# Patient Record
Sex: Female | Born: 1985 | Race: Black or African American | Hispanic: No | Marital: Single | Smoking: Current every day smoker
Health system: Southern US, Community
[De-identification: ages and names within clinical notes are randomized; demographics above are authoritative.]

---

## 2006-01-24 ENCOUNTER — Emergency Department (HOSPITAL_COMMUNITY): Admission: EM | Admit: 2006-01-24 | Discharge: 2006-01-24 | Payer: Self-pay | Admitting: Emergency Medicine

## 2006-02-05 ENCOUNTER — Emergency Department (HOSPITAL_COMMUNITY): Admission: EM | Admit: 2006-02-05 | Discharge: 2006-02-05 | Payer: Self-pay | Admitting: Emergency Medicine

## 2006-07-09 ENCOUNTER — Emergency Department (HOSPITAL_COMMUNITY): Admission: EM | Admit: 2006-07-09 | Discharge: 2006-07-09 | Payer: Self-pay | Admitting: Emergency Medicine

## 2006-07-22 ENCOUNTER — Inpatient Hospital Stay (HOSPITAL_COMMUNITY): Admission: AD | Admit: 2006-07-22 | Discharge: 2006-07-22 | Payer: Self-pay | Admitting: Obstetrics & Gynecology

## 2015-07-09 ENCOUNTER — Emergency Department
Admission: EM | Admit: 2015-07-09 | Discharge: 2015-07-09 | Disposition: A | Discharge status: Home / Self Care | Payer: Medicaid Other | Attending: Emergency Medicine | Admitting: Emergency Medicine

## 2015-07-09 ENCOUNTER — Emergency Department: Payer: Medicaid Other

## 2015-07-09 ENCOUNTER — Encounter: Payer: Self-pay | Admitting: Medical Oncology

## 2015-07-09 DIAGNOSIS — Z3202 Encounter for pregnancy test, result negative: Secondary | ICD-10-CM | POA: Diagnosis not present

## 2015-07-09 DIAGNOSIS — F419 Anxiety disorder, unspecified: Secondary | ICD-10-CM | POA: Insufficient documentation

## 2015-07-09 DIAGNOSIS — F17200 Nicotine dependence, unspecified, uncomplicated: Secondary | ICD-10-CM | POA: Insufficient documentation

## 2015-07-09 DIAGNOSIS — N200 Calculus of kidney: Secondary | ICD-10-CM | POA: Insufficient documentation

## 2015-07-09 DIAGNOSIS — R109 Unspecified abdominal pain: Secondary | ICD-10-CM | POA: Diagnosis present

## 2015-07-09 DIAGNOSIS — R1032 Left lower quadrant pain: Secondary | ICD-10-CM

## 2015-07-09 LAB — CBC
HCT: 39.9 % (ref 35.0–47.0)
Hemoglobin: 13.1 g/dL (ref 12.0–16.0)
MCH: 27.8 pg (ref 26.0–34.0)
MCHC: 32.8 g/dL (ref 32.0–36.0)
MCV: 84.8 fL (ref 80.0–100.0)
PLATELETS: 156 10*3/uL (ref 150–440)
RBC: 4.71 MIL/uL (ref 3.80–5.20)
RDW: 13.5 % (ref 11.5–14.5)
WBC: 7.7 10*3/uL (ref 3.6–11.0)

## 2015-07-09 LAB — URINALYSIS COMPLETE WITH MICROSCOPIC (ARMC ONLY)
BILIRUBIN URINE: NEGATIVE
Bacteria, UA: NONE SEEN
GLUCOSE, UA: NEGATIVE mg/dL
HGB URINE DIPSTICK: NEGATIVE
Nitrite: NEGATIVE
Protein, ur: 30 mg/dL — AB
SPECIFIC GRAVITY, URINE: 1.02 (ref 1.005–1.030)
pH: 6 (ref 5.0–8.0)

## 2015-07-09 LAB — COMPREHENSIVE METABOLIC PANEL
ALT: 17 U/L (ref 14–54)
ANION GAP: 12 (ref 5–15)
AST: 28 U/L (ref 15–41)
Albumin: 4.6 g/dL (ref 3.5–5.0)
Alkaline Phosphatase: 77 U/L (ref 38–126)
BILIRUBIN TOTAL: 0.5 mg/dL (ref 0.3–1.2)
BUN: 14 mg/dL (ref 6–20)
CO2: 21 mmol/L — ABNORMAL LOW (ref 22–32)
Calcium: 9.5 mg/dL (ref 8.9–10.3)
Chloride: 106 mmol/L (ref 101–111)
Creatinine, Ser: 0.96 mg/dL (ref 0.44–1.00)
GFR calc Af Amer: >60 mL/min (ref >60)
Glucose, Bld: 101 mg/dL — ABNORMAL HIGH (ref 65–99)
POTASSIUM: 3 mmol/L — AB (ref 3.5–5.1)
Sodium: 139 mmol/L (ref 135–145)
TOTAL PROTEIN: 7.4 g/dL (ref 6.5–8.1)

## 2015-07-09 LAB — POCT PREGNANCY, URINE: PREG TEST UR: NEGATIVE

## 2015-07-09 LAB — LIPASE, BLOOD: Lipase: 32 U/L (ref 11–51)

## 2015-07-09 MED ORDER — HYDROMORPHONE HCL 2 MG PO TABS
2.0000 mg | ORAL_TABLET | Freq: Once | ORAL | Status: AC
  Administered 2015-07-09: 2 mg via ORAL
  Filled 2015-07-09: qty 1

## 2015-07-09 MED ORDER — HYDROMORPHONE HCL 1 MG/ML IJ SOLN
0.5000 mg | Freq: Once | INTRAMUSCULAR | Status: AC
  Administered 2015-07-09: 0.5 mg via INTRAVENOUS
  Filled 2015-07-09: qty 1

## 2015-07-09 MED ORDER — POTASSIUM CHLORIDE CRYS ER 20 MEQ PO TBCR
20.0000 meq | EXTENDED_RELEASE_TABLET | Freq: Once | ORAL | Status: AC
  Administered 2015-07-09: 20 meq via ORAL

## 2015-07-09 MED ORDER — ONDANSETRON HCL 4 MG PO TABS: 4.0000 mg | ORAL_TABLET | Freq: Three times a day (TID) | ORAL | Status: DC | PRN

## 2015-07-09 MED ORDER — ONDANSETRON HCL 4 MG/2ML IJ SOLN
4.0000 mg | Freq: Once | INTRAMUSCULAR | Status: AC
  Administered 2015-07-09: 4 mg via INTRAVENOUS
  Filled 2015-07-09: qty 2

## 2015-07-09 MED ORDER — POTASSIUM CHLORIDE CRYS ER 20 MEQ PO TBCR
EXTENDED_RELEASE_TABLET | ORAL | Status: AC
  Administered 2015-07-09: 20 meq via ORAL
  Filled 2015-07-09: qty 1

## 2015-07-09 MED ORDER — HYDROMORPHONE HCL 2 MG PO TABS: 2.0000 mg | ORAL_TABLET | Freq: Four times a day (QID) | ORAL | Status: DC | PRN

## 2015-07-09 MED ORDER — ONDANSETRON HCL 4 MG/2ML IJ SOLN
INTRAMUSCULAR | Status: AC
  Administered 2015-07-09: 4 mg via INTRAVENOUS
  Filled 2015-07-09: qty 2

## 2015-07-09 MED ORDER — SODIUM CHLORIDE 0.9 % IV BOLUS (SEPSIS)
1000.0000 mL | Freq: Once | INTRAVENOUS | Status: AC
  Administered 2015-07-09: 1000 mL via INTRAVENOUS

## 2015-07-09 MED ORDER — LORAZEPAM 2 MG/ML IJ SOLN
1.0000 mg | Freq: Once | INTRAMUSCULAR | Status: AC
  Administered 2015-07-09: 1 mg via INTRAVENOUS
  Filled 2015-07-09: qty 1

## 2015-07-09 MED ORDER — IBUPROFEN 800 MG PO TABS: 800.0000 mg | ORAL_TABLET | Freq: Three times a day (TID) | ORAL | Status: DC | PRN

## 2015-07-09 MED ORDER — KETOROLAC TROMETHAMINE 30 MG/ML IJ SOLN
30.0000 mg | Freq: Once | INTRAMUSCULAR | Status: AC
  Administered 2015-07-09: 30 mg via INTRAVENOUS
  Filled 2015-07-09: qty 1

## 2015-07-09 MED ORDER — ONDANSETRON HCL 4 MG/2ML IJ SOLN
4.0000 mg | Freq: Once | INTRAMUSCULAR | Status: AC
  Administered 2015-07-09: 4 mg via INTRAVENOUS

## 2015-07-09 NOTE — Discharge Instructions (Signed)
Kidney Stones °Kidney stones (urolithiasis) are deposits that form inside your kidneys. The intense pain is caused by the stone moving through the urinary tract. When the stone moves, the ureter goes into spasm around the stone. The stone is usually passed in the urine.  °CAUSES  °· A disorder that makes certain neck glands produce too much parathyroid hormone (primary hyperparathyroidism). °· A buildup of uric acid crystals, similar to gout in your joints. °· Narrowing (stricture) of the ureter. °· A kidney obstruction present at birth (congenital obstruction). °· Previous surgery on the kidney or ureters. °· Numerous kidney infections. °SYMPTOMS  °· Feeling sick to your stomach (nauseous). °· Throwing up (vomiting). °· Blood in the urine (hematuria). °· Pain that usually spreads (radiates) to the groin. °· Frequency or urgency of urination. °DIAGNOSIS  °· Taking a history and physical exam. °· Blood or urine tests. °· CT scan. °· Occasionally, an examination of the inside of the urinary bladder (cystoscopy) is performed. °TREATMENT  °· Observation. °· Increasing your fluid intake. °· Extracorporeal shock wave lithotripsy--This is a noninvasive procedure that uses shock waves to break up kidney stones. °· Surgery may be needed if you have severe pain or persistent obstruction. There are various surgical procedures. Most of the procedures are performed with the use of small instruments. Only small incisions are needed to accommodate these instruments, so recovery time is minimized. °The size, location, and chemical composition are all important variables that will determine the proper choice of action for you. Talk to your health care provider to better understand your situation so that you will minimize the risk of injury to yourself and your kidney.  °HOME CARE INSTRUCTIONS  °· Drink enough water and fluids to keep your urine clear or pale yellow. This will help you to pass the stone or stone fragments. °· Strain  all urine through the provided strainer. Keep all particulate matter and stones for your health care provider to see. The stone causing the pain may be as small as a grain of salt. It is very important to use the strainer each and every time you pass your urine. The collection of your stone will allow your health care provider to analyze it and verify that a stone has actually passed. The stone analysis will often identify what you can do to reduce the incidence of recurrences. °· Only take over-the-counter or prescription medicines for pain, discomfort, or fever as directed by your health care provider. °· Keep all follow-up visits as told by your health care provider. This is important. °· Get follow-up X-rays if required. The absence of pain does not always mean that the stone has passed. It may have only stopped moving. If the urine remains completely obstructed, it can cause loss of kidney function or even complete destruction of the kidney. It is your responsibility to make sure X-rays and follow-ups are completed. Ultrasounds of the kidney can show blockages and the status of the kidney. Ultrasounds are not associated with any radiation and can be performed easily in a matter of minutes. °· Make changes to your daily diet as told by your health care provider. You may be told to: °¨ Limit the amount of salt that you eat. °¨ Eat 5 or more servings of fruits and vegetables each day. °¨ Limit the amount of meat, poultry, fish, and eggs that you eat. °· Collect a 24-hour urine sample as told by your health care provider. You may need to collect another urine sample every 6-12   months. °SEEK MEDICAL CARE IF: °· You experience pain that is progressive and unresponsive to any pain medicine you have been prescribed. °SEEK IMMEDIATE MEDICAL CARE IF:  °· Pain cannot be controlled with the prescribed medicine. °· You have a fever or shaking chills. °· The severity or intensity of pain increases over 18 hours and is not  relieved by pain medicine. °· You develop a new onset of abdominal pain. °· You feel faint or pass out. °· You are unable to urinate. °  °This information is not intended to replace advice given to you by your health care provider. Make sure you discuss any questions you have with your health care provider. °  °Document Released: 06/28/2005 Document Revised: 03/19/2015 Document Reviewed: 11/29/2012 °Elsevier Interactive Patient Education ©2016 [employer] ° °Please return immediately if condition worsens. Please contact her primary physician or the physician you were given for referral. If you have any specialist physicians involved in her treatment and plan please also contact them. Thank you for using [facility]. ° °

## 2015-07-09 NOTE — ED Provider Notes (Signed)
Time Seen: Approximately 1645  I have reviewed the triage notes  Chief Complaint: Abdominal Pain   History of Present Illness: April DoryCiera Copeland is a 29 y.o. female who reports that she's had some left-sided abdominal pain now for the last week. The patient states she was seen and evaluated by her primary physician was diagnosed with a urinary tract infection and has a current prescription for Macrodantin. Patient states her pain seems to wax and wane in its intensity but is usually present. She states she's had a hard time finding a comfortable position. She denies any fever at home. She's had persistent nausea and vomiting. She states she's had one loose bowel movement but no persistent diarrhea at this time. She denies any melena or hematochezia, vaginal discharge or bleeding. She is not aware of any dysuria, hematuria or urinary frequency. She denies any right-sided abdominal pain   History reviewed. No pertinent past medical history.  There are no active problems to display for this patient.   History reviewed. No pertinent past surgical history.  History reviewed. No pertinent past surgical history.  Current Outpatient Rx  Name  Route  Sig  Dispense  Refill  . HYDROmorphone (DILAUDID) 2 MG tablet   Oral   Take 1 tablet (2 mg total) by mouth every 6 (six) hours as needed for severe pain.   28 tablet   0   . ibuprofen (ADVIL,MOTRIN) 800 MG tablet   Oral   Take 1 tablet (800 mg total) by mouth every 8 (eight) hours as needed.   30 tablet   0   . ondansetron (ZOFRAN) 4 MG tablet   Oral   Take 1 tablet (4 mg total) by mouth every 8 (eight) hours as needed for nausea or vomiting.   21 tablet   0     Allergies:  Review of patient's allergies indicates no known allergies.  Family History: No family history on file.  Social History: Social History  Substance Use Topics  . Smoking status: Current Every Day Smoker  . Smokeless tobacco: None  . Alcohol Use: No      Review of Systems:   10 point review of systems was performed and was otherwise negative:  Constitutional: No fever Eyes: No visual disturbances ENT: No sore throat, ear pain Cardiac: No chest pain Respiratory: No shortness of breath, wheezing, or stridor Abdomen: Pain is difficult for the patient to characterize aware of her location of her discomfort is she points mainly to the left upper and left lower abdominal region Endocrine: No weight loss, No night sweats Extremities: No peripheral edema, cyanosis Skin: No rashes, easy bruising Neurologic: No focal weakness, trouble with speech or swollowing Urologic: No dysuria, Hematuria, or urinary frequency   Physical Exam:  ED Triage Vitals  Enc Vitals Group     BP 07/09/15 1525 113/84 mmHg     Pulse Rate 07/09/15 1525 83     Resp 07/09/15 1525 18     Temp 07/09/15 1525 98.1 F (36.7 C)     Temp Source 07/09/15 1525 Oral     SpO2 07/09/15 1525 99 %     Weight 07/09/15 1525 152 lb (68.947 kg)     Height 07/09/15 1525 5\' 5"  (1.651 m)     Head Cir --      Peak Flow --      Pain Score 07/09/15 1526 10     Pain Loc --      Pain Edu? --  Excl. in GC? --     General: Awake , Alert , and Oriented times 3; very anxious GCS 15 Head: Normal cephalic , atraumatic Eyes: Pupils equal , round, reactive to light Nose/Throat: No nasal drainage, patent upper airway without erythema or exudate.  Neck: Supple, Full range of motion, No anterior adenopathy or palpable thyroid masses Lungs: Clear to ascultation without wheezes , rhonchi, or rales Heart: Regular rate, regular rhythm without murmurs , gallops , or rubs Abdomen: Mild tenderness over the left upper and lower quadrant without any rebound guarding or rigidity bowel sounds positive and symmetric in all 4 quadrants. No organomegaly .        Extremities: 2 plus symmetric pulses. No edema, clubbing or cyanosis Neurologic: normal ambulation, Motor symmetric without deficits,  sensory intact Skin: warm, dry, no rashes   Labs:   All laboratory work was reviewed including any pertinent negatives or positives listed below:  Labs Reviewed  COMPREHENSIVE METABOLIC PANEL - Abnormal; Notable for the following:    Potassium 3.0 (*)    CO2 21 (*)    Glucose, Bld 101 (*)    All other components within normal limits  URINALYSIS COMPLETEWITH MICROSCOPIC ([facility] ONLY) - Abnormal; Notable for the following:    Color, Urine YELLOW (*)    APPearance CLEAR (*)    Ketones, ur 1+ (*)    Protein, ur 30 (*)    Leukocytes, UA TRACE (*)    Squamous Epithelial / LPF 0-5 (*)    All other components within normal limits  URINE CULTURE  LIPASE, BLOOD  CBC  POC URINE PREG, ED  POCT PREGNANCY, URINE   Review of laboratory work shows some leukocytes present but no bacteria present.   Radiology:    I personally reviewed the radiologic studies EXAM: CT ABDOMEN AND PELVIS WITHOUT CONTRAST  TECHNIQUE: Multidetector CT imaging of the abdomen and pelvis was performed following the standard protocol without IV contrast.  COMPARISON: None.  FINDINGS: Lower chest: No significant pulmonary nodules or acute consolidative airspace disease.  Hepatobiliary: Normal liver with no liver mass. Normal gallbladder with no radiopaque cholelithiasis. No biliary ductal dilatation.  Pancreas: Normal, with no mass or duct dilation.  Spleen: Normal size. No mass.  Adrenals/Urinary Tract: Normal adrenals. There is a 0.7 cm hypodense structure in the upper right kidney with a 2 mm calcification in the posterior aspect of the structure (series 2/image 28). Otherwise no right renal stones. No right hydronephrosis. Obstructing 4 mm stone at the left ureterovesical junction, with mild left hydroureteronephrosis and mild left perinephric fat stranding. No stones within the left kidney. No contour deforming renal mass. Normal bladder.  Stomach/Bowel: Grossly normal stomach. Normal caliber  small bowel with no small bowel wall thickening. Normal appendix. Normal large bowel with no diverticulosis, large bowel wall thickening or pericolonic fat stranding.  Vascular/Lymphatic: Normal caliber abdominal aorta. No pathologically enlarged lymph nodes in the abdomen or pelvis.  Reproductive: Grossly normal mildly retroflexed uterus. No adnexal mass.  Other: Small volume simple free fluid in the pelvic cul-de-sac is likely physiologic. No abdominal ascites. No pneumoperitoneum. No focal fluid collection.  Musculoskeletal: No aggressive appearing focal osseous lesions.  IMPRESSION: 1. Obstructing 4 mm stone at the left UVJ, with mild left hydroureteronephrosis and mild left perinephric fat stranding. 2. 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 Recommend follow-up renal sonogram  in 6 months to document stability.    ED Course:  Patient's stay here was uneventful. She had some gradual improvement in her pain. She does have some mild low potassium was given oral potassium here in emergency department. Her persistent nausea and vomiting seems to be under control. Her current clinical presentation and objective findings appears that she has a obstructing stone in the left ureterovesicular junction. Given the size and location of her kidney stone I felt it was most likely to pass in the short period of time. I advised her to return here if she develops a fever, uncontrolled vomiting, or uncontrolled pain. She was given prescription for ibuprofen, Zofran, and Dilaudid on an outpatient basis. Also advised to continue with Macrodantin and has a urine culture pending.  Assessment:  Acute renal colic  Final Clinical Impression:   Final diagnoses:  Abdominal pain, left lower quadrant  Kidney stone     Plan:   Outpatient management Patient was referred to urology unassigned            Jennye Moccasin, MD 07/09/15 2207

## 2015-07-09 NOTE — ED Notes (Signed)
Patient transported to CT 

## 2015-07-09 NOTE — ED Notes (Signed)
Pt to ed via ems with reports of lower abd pain that began 1 week ago and subsided then came back with nausea and vomiting. Pt also took 5mg  oxycodone at home pta.

## 2015-07-09 NOTE — ED Notes (Signed)
Pt complaining of "blacking out". RN notified and assessed patient. VSS. Additional warm blankets given

## 2015-07-09 NOTE — ED Notes (Signed)
Pt. Going home with family. 

## 2015-07-11 LAB — URINE CULTURE

## 2015-09-10 ENCOUNTER — Emergency Department: Payer: Medicaid Other

## 2015-09-10 ENCOUNTER — Emergency Department
Admission: EM | Admit: 2015-09-10 | Discharge: 2015-09-10 | Disposition: A | Discharge status: Home / Self Care | Payer: Medicaid Other | Attending: Emergency Medicine | Admitting: Emergency Medicine

## 2015-09-10 DIAGNOSIS — Z3A01 Less than 8 weeks gestation of pregnancy: Secondary | ICD-10-CM | POA: Insufficient documentation

## 2015-09-10 DIAGNOSIS — N83202 Unspecified ovarian cyst, left side: Secondary | ICD-10-CM | POA: Insufficient documentation

## 2015-09-10 DIAGNOSIS — N760 Acute vaginitis: Secondary | ICD-10-CM

## 2015-09-10 DIAGNOSIS — A599 Trichomoniasis, unspecified: Secondary | ICD-10-CM

## 2015-09-10 DIAGNOSIS — R197 Diarrhea, unspecified: Secondary | ICD-10-CM | POA: Diagnosis not present

## 2015-09-10 DIAGNOSIS — A5901 Trichomonal vulvovaginitis: Secondary | ICD-10-CM | POA: Diagnosis not present

## 2015-09-10 DIAGNOSIS — O23591 Infection of other part of genital tract in pregnancy, first trimester: Secondary | ICD-10-CM | POA: Insufficient documentation

## 2015-09-10 DIAGNOSIS — O3481 Maternal care for other abnormalities of pelvic organs, first trimester: Secondary | ICD-10-CM | POA: Diagnosis not present

## 2015-09-10 DIAGNOSIS — O99331 Smoking (tobacco) complicating pregnancy, first trimester: Secondary | ICD-10-CM | POA: Diagnosis not present

## 2015-09-10 DIAGNOSIS — O210 Mild hyperemesis gravidarum: Secondary | ICD-10-CM | POA: Insufficient documentation

## 2015-09-10 DIAGNOSIS — O26899 Other specified pregnancy related conditions, unspecified trimester: Secondary | ICD-10-CM

## 2015-09-10 DIAGNOSIS — F17200 Nicotine dependence, unspecified, uncomplicated: Secondary | ICD-10-CM | POA: Insufficient documentation

## 2015-09-10 DIAGNOSIS — O9989 Other specified diseases and conditions complicating pregnancy, childbirth and the puerperium: Secondary | ICD-10-CM | POA: Diagnosis present

## 2015-09-10 DIAGNOSIS — R109 Unspecified abdominal pain: Secondary | ICD-10-CM

## 2015-09-10 DIAGNOSIS — R112 Nausea with vomiting, unspecified: Secondary | ICD-10-CM

## 2015-09-10 DIAGNOSIS — B9689 Other specified bacterial agents as the cause of diseases classified elsewhere: Secondary | ICD-10-CM

## 2015-09-10 LAB — CBC
HEMATOCRIT: 40.5 % (ref 35.0–47.0)
HEMOGLOBIN: 13.4 g/dL (ref 12.0–16.0)
MCH: 28 pg (ref 26.0–34.0)
MCHC: 33.1 g/dL (ref 32.0–36.0)
MCV: 84.7 fL (ref 80.0–100.0)
PLATELETS: 176 10*3/uL (ref 150–440)
RBC: 4.78 MIL/uL (ref 3.80–5.20)
RDW: 15.1 % — ABNORMAL HIGH (ref 11.5–14.5)
WBC: 3.7 10*3/uL (ref 3.6–11.0)

## 2015-09-10 LAB — COMPREHENSIVE METABOLIC PANEL WITH GFR
ALT: 12 U/L — ABNORMAL LOW (ref 14–54)
AST: 24 U/L (ref 15–41)
Albumin: 4.4 g/dL (ref 3.5–5.0)
Alkaline Phosphatase: 64 U/L (ref 38–126)
Anion gap: 7 (ref 5–15)
BUN: 6 mg/dL (ref 6–20)
CO2: 22 mmol/L (ref 22–32)
Calcium: 9.6 mg/dL (ref 8.9–10.3)
Chloride: 110 mmol/L (ref 101–111)
Creatinine, Ser: 0.64 mg/dL (ref 0.44–1.00)
GFR calc Af Amer: >60 mL/min (ref >60)
GFR calc non Af Amer: >60 mL/min (ref >60)
Glucose, Bld: 91 mg/dL (ref 65–99)
Potassium: 3.4 mmol/L — ABNORMAL LOW (ref 3.5–5.1)
Sodium: 139 mmol/L (ref 135–145)
Total Bilirubin: 0.4 mg/dL (ref 0.3–1.2)
Total Protein: 7.4 g/dL (ref 6.5–8.1)

## 2015-09-10 LAB — POCT PREGNANCY, URINE: Preg Test, Ur: POSITIVE — AB

## 2015-09-10 LAB — URINALYSIS COMPLETE WITH MICROSCOPIC (ARMC ONLY)
Bacteria, UA: NONE SEEN
Bilirubin Urine: NEGATIVE
Glucose, UA: NEGATIVE mg/dL
Hgb urine dipstick: NEGATIVE
Ketones, ur: NEGATIVE mg/dL
Leukocytes, UA: NEGATIVE
Nitrite: NEGATIVE
Protein, ur: NEGATIVE mg/dL
Specific Gravity, Urine: 1.013 (ref 1.005–1.030)
pH: 9 — ABNORMAL HIGH (ref 5.0–8.0)

## 2015-09-10 LAB — LIPASE, BLOOD: Lipase: 43 U/L (ref 11–51)

## 2015-09-10 LAB — WET PREP, GENITAL
Sperm: NONE SEEN
Yeast Wet Prep HPF POC: NONE SEEN

## 2015-09-10 LAB — CHLAMYDIA/NGC RT PCR (ARMC ONLY)
CHLAMYDIA TR: NOT DETECTED
N gonorrhoeae: NOT DETECTED

## 2015-09-10 LAB — HCG, QUANTITATIVE, PREGNANCY: hCG, Beta Chain, Quant, S: 138 m[IU]/mL — ABNORMAL HIGH (ref <5)

## 2015-09-10 MED ORDER — CEFTRIAXONE SODIUM 250 MG IJ SOLR
INTRAMUSCULAR | Status: AC
  Filled 2015-09-10: qty 250

## 2015-09-10 MED ORDER — AZITHROMYCIN 1 G PO PACK
1.0000 g | PACK | Freq: Once | ORAL | Status: AC
  Administered 2015-09-10: 1 g via ORAL

## 2015-09-10 MED ORDER — METOCLOPRAMIDE HCL 10 MG PO TABS: 10.0000 mg | ORAL_TABLET | Freq: Four times a day (QID) | ORAL | Status: DC | PRN

## 2015-09-10 MED ORDER — METRONIDAZOLE 500 MG PO TABS
500.0000 mg | ORAL_TABLET | Freq: Once | ORAL | Status: AC
  Administered 2015-09-10: 500 mg via ORAL

## 2015-09-10 MED ORDER — CEFTRIAXONE SODIUM 250 MG IJ SOLR
250.0000 mg | Freq: Once | INTRAMUSCULAR | Status: AC
  Administered 2015-09-10: 250 mg via INTRAMUSCULAR

## 2015-09-10 MED ORDER — METRONIDAZOLE 500 MG PO TABS
ORAL_TABLET | ORAL | Status: AC
  Filled 2015-09-10: qty 1

## 2015-09-10 MED ORDER — AZITHROMYCIN 1 G PO PACK
PACK | ORAL | Status: AC
  Filled 2015-09-10: qty 1

## 2015-09-10 MED ORDER — METRONIDAZOLE 500 MG PO TABS: 500.0000 mg | ORAL_TABLET | Freq: Two times a day (BID) | ORAL | Status: AC

## 2015-09-10 MED ORDER — METOCLOPRAMIDE HCL 10 MG PO TABS
10.0000 mg | ORAL_TABLET | Freq: Once | ORAL | Status: AC
  Administered 2015-09-10: 10 mg via ORAL
  Filled 2015-09-10: qty 1

## 2015-09-10 MED ORDER — PRENATAL VITAMINS 0.8 MG PO TABS: 1.0000 | ORAL_TABLET | Freq: Every day | ORAL | Status: DC

## 2015-09-10 NOTE — ED Provider Notes (Addendum)
[facility] Emergency Department Provider Note  ____________________________________________  Time seen: Approximately 4:25PM  I have reviewed the triage vital signs and the nursing notes.   HISTORY  Chief Complaint Abdominal Pain    HPI April Copeland is a 30 y.o. female with a history of Chlamydia and an ectopic pregnancy who is presenting today with left lower quadrant abdominal pain which started suddenly at 4 AM this morning. She's also been having nausea vomiting and diarrhea over the past 4 days. No known sick contacts. No vaginal bleeding or discharge. The pain is nonradiating. Questions if it is another kidney stone but said that the last MG kidney stone she did have radiation to her back with the pain. She describes the pain as an 8 or 9 out of 10 and cramping. Denies any dysuria. Says that she has had a small amount of blood in her stool. Says that her periods are regular. She is a G7 P3.   No past medical history on file.  There are no active problems to display for this patient.   No past surgical history on file.  Current Outpatient Rx  Name  Route  Sig  Dispense  Refill  . HYDROmorphone (DILAUDID) 2 MG tablet   Oral   Take 1 tablet (2 mg total) by mouth every 6 (six) hours as needed for severe pain.   28 tablet   0   . ibuprofen (ADVIL,MOTRIN) 800 MG tablet   Oral   Take 1 tablet (800 mg total) by mouth every 8 (eight) hours as needed.   30 tablet   0   . ondansetron (ZOFRAN) 4 MG tablet   Oral   Take 1 tablet (4 mg total) by mouth every 8 (eight) hours as needed for nausea or vomiting.   21 tablet   0     Allergies Review of patient's allergies indicates no known allergies.  No family history on file.  Social History Social History  Substance Use Topics  . Smoking status: Current Every Day Smoker  . Smokeless tobacco: Not on file  . Alcohol Use: No    Review of Systems Constitutional: No fever/chills Eyes: No visual  changes. ENT: No sore throat. Cardiovascular: Denies chest pain. Respiratory: Denies shortness of breath. Gastrointestinal:  No constipation. Genitourinary: Negative for dysuria. Musculoskeletal: Negative for back pain. Skin: Negative for rash. Neurological: Negative for headaches, focal weakness or numbness.  10-point ROS otherwise negative.  ____________________________________________   PHYSICAL EXAM:  VITAL SIGNS: ED Triage Vitals  Enc Vitals Group     BP 09/10/15 1523 93/64 mmHg     Pulse Rate 09/10/15 1523 68     Resp 09/10/15 1523 16     Temp 09/10/15 1523 98.5 F (36.9 C)     Temp Source 09/10/15 1523 Oral     SpO2 09/10/15 1523 98 %     Weight --      Height --      Head Cir --      Peak Flow --      Pain Score 09/10/15 1525 10     Pain Loc --      Pain Edu? --      Excl. in GC? --     Constitutional: Alert and oriented. Well appearing and in no acute distress. Eyes: Conjunctivae are normal. PERRL. EOMI. Head: Atraumatic. Nose: No congestion/rhinnorhea. Mouth/Throat: Mucous membranes are moist.  Oropharynx non-erythematous. Neck: No stridor.   Cardiovascular: Normal rate, regular rhythm. Grossly normal heart  sounds.  Good peripheral circulation. Respiratory: Normal respiratory effort.  No retractions. Lungs CTAB. Gastrointestinal: Soft with mild epigastric as well as left lower quadrant abdominal tenderness to palpation. There is no rebound or guarding or masses. No distention.No CVA tenderness. Rectal exam with brown stool which is heme-negative. Genitourinary: Normal external exam. Speculum exam with a mild-to-moderate amount of white discharge. Bimanual exam with a closed cervical os. There is no CMT. No uterine or adnexal tenderness to palpation. Musculoskeletal: No lower extremity tenderness nor edema.  No joint effusions. Neurologic:  Normal speech and language. No gross focal neurologic deficits are appreciated. No gait instability. Skin:  Skin is  warm, dry and intact. No rash noted. Psychiatric: Mood and affect are normal. Speech and behavior are normal.  ____________________________________________   LABS (all labs ordered are listed, but only abnormal results are displayed)  Labs Reviewed  WET PREP, GENITAL - Abnormal; Notable for the following:    Trich, Wet Prep PRESENT (*)    Clue Cells Wet Prep HPF POC PRESENT (*)    WBC, Wet Prep HPF POC FEW (*)    All other components within normal limits  COMPREHENSIVE METABOLIC PANEL - Abnormal; Notable for the following:    Potassium 3.4 (*)    ALT 12 (*)    All other components within normal limits  CBC - Abnormal; Notable for the following:    RDW 15.1 (*)    All other components within normal limits  URINALYSIS COMPLETEWITH MICROSCOPIC ([facility] ONLY) - Abnormal; Notable for the following:    Color, Urine YELLOW (*)    APPearance CLEAR (*)    pH 9.0 (*)    Squamous Epithelial / LPF 0-5 (*)    All other components within normal limits  HCG, QUANTITATIVE, PREGNANCY - Abnormal; Notable for the following:    hCG, Beta Chain, Quant, S 138 (*)    All other components within normal limits  POCT PREGNANCY, URINE - Abnormal; Notable for the following:    Preg Test, Ur POSITIVE (*)    All other components within normal limits  CHLAMYDIA/NGC RT PCR ([facility] ONLY)  LIPASE, BLOOD  POC URINE PREG, ED   ____________________________________________  EKG   ____________________________________________  RADIOLOGY  IMPRESSION: 1. No IUP identified at this time. 2. Degenerating corpus luteum cyst in the left ovary. 3. Small volume of free fluid in the cul-de-sac, presumably physiologic.   Electronically Signed By: Trudie Reed M.D. On: 09/10/2015 17:32 ____________________________________________   PROCEDURES    ____________________________________________   INITIAL IMPRESSION / ASSESSMENT AND PLAN / ED COURSE  Pertinent labs & imaging results that were  available during my care of the patient were reviewed by me and considered in my medical decision making (see chart for details).  ----------------------------------------- 6:39 PM on 09/10/2015 -----------------------------------------  Patient lying on the stretcher at this time without any distress. We discussed her lab as well as imaging results. She is aware of the cyst to her left ovary. We also discussed the very low hCG level and this could be either a failed pregnancy or a very early pregnancy. However, with the cyst read as degenerating is likely a failed pregnancy. No IUP was identified. She knows that she must follow-up in 2 days with her OB/GYN at [facility] for repeat hCG/hormone level. We also discussed the positive Trichomonas as well as clue cells. She will be treated for cervicitis as well as the Trichomonas. She understands that she was not have any sexual intercourse until further testing for other  STDs such as syphilis, herpes and HIV. She understands that she must also discussed this with her partner and that her partner should not be having any sex until he is treated and cleared. We'll discharge with antiemetics. Patient has been tolerating by mouth fluids here in the emergency department. Pain possibly secondary to early pregnancy versus nausea vomiting and diarrhea.  ____________________________________________   FINAL CLINICAL IMPRESSION(S) / ED DIAGNOSES  Abdominal pain in pregnancy. Trichomonas. Bacterial vaginosis. Nausea vomiting and diarrhea.    Myrna Blazer, MD 09/10/15 1850  Also discussed return precautions with the patient such as any worsening pain or other concerning symptoms.  Myrna Blazer, MD 09/10/15 [phone]

## 2015-09-10 NOTE — ED Notes (Signed)
Pt reports about 4 days of N/V/D.  Reports having abdominal pain that started today. Pt is located on left lower quadrant.

## 2015-09-10 NOTE — ED Notes (Signed)
Pt presents with diarrhea x 4 days, vomiting x 2 days, and llq abdominal pain today. Pt states she feels like she did when she had kidney stones 2-3 weeks ago. Denies blood in urine, positive for increased urinary frequency. Pt alert & oriented, moaning during assessment.

## 2015-09-10 NOTE — ED Notes (Signed)
Per Ems she developed abd pain for about 4 days pos n/v.  Last time vomited today

## 2017-11-29 ENCOUNTER — Emergency Department
Admission: EM | Admit: 2017-11-29 | Discharge: 2017-11-29 | Disposition: A | Discharge status: Home / Self Care | Payer: Medicaid Other | Attending: Emergency Medicine | Admitting: Emergency Medicine

## 2017-11-29 ENCOUNTER — Encounter: Payer: Self-pay | Admitting: Emergency Medicine

## 2017-11-29 ENCOUNTER — Emergency Department: Payer: Medicaid Other

## 2017-11-29 ENCOUNTER — Other Ambulatory Visit: Payer: Self-pay

## 2017-11-29 DIAGNOSIS — F1210 Cannabis abuse, uncomplicated: Secondary | ICD-10-CM | POA: Diagnosis not present

## 2017-11-29 DIAGNOSIS — R102 Pelvic and perineal pain: Secondary | ICD-10-CM | POA: Diagnosis not present

## 2017-11-29 DIAGNOSIS — F17200 Nicotine dependence, unspecified, uncomplicated: Secondary | ICD-10-CM | POA: Insufficient documentation

## 2017-11-29 DIAGNOSIS — R1031 Right lower quadrant pain: Secondary | ICD-10-CM | POA: Diagnosis not present

## 2017-11-29 DIAGNOSIS — R112 Nausea with vomiting, unspecified: Secondary | ICD-10-CM | POA: Insufficient documentation

## 2017-11-29 DIAGNOSIS — R109 Unspecified abdominal pain: Secondary | ICD-10-CM | POA: Diagnosis present

## 2017-11-29 DIAGNOSIS — Z79899 Other long term (current) drug therapy: Secondary | ICD-10-CM | POA: Insufficient documentation

## 2017-11-29 LAB — CBC
HEMATOCRIT: 38.8 % (ref 35.0–47.0)
HEMOGLOBIN: 12.9 g/dL (ref 12.0–16.0)
MCH: 27.7 pg (ref 26.0–34.0)
MCHC: 33.3 g/dL (ref 32.0–36.0)
MCV: 83.4 fL (ref 80.0–100.0)
PLATELETS: 211 10*3/uL (ref 150–440)
RBC: 4.66 MIL/uL (ref 3.80–5.20)
RDW: 15.8 % — ABNORMAL HIGH (ref 11.5–14.5)
WBC: 4.5 10*3/uL (ref 3.6–11.0)

## 2017-11-29 LAB — URINALYSIS, COMPLETE (UACMP) WITH MICROSCOPIC
BACTERIA UA: NONE SEEN
Bilirubin Urine: NEGATIVE
GLUCOSE, UA: NEGATIVE mg/dL
Hgb urine dipstick: NEGATIVE
KETONES UR: 20 mg/dL — AB
LEUKOCYTES UA: NEGATIVE
Nitrite: NEGATIVE
PROTEIN: 100 mg/dL — AB
Specific Gravity, Urine: 1.013 (ref 1.005–1.030)
pH: 9 — ABNORMAL HIGH (ref 5.0–8.0)

## 2017-11-29 LAB — COMPREHENSIVE METABOLIC PANEL
ALT: 13 U/L — AB (ref 14–54)
AST: 35 U/L (ref 15–41)
Albumin: 3.6 g/dL (ref 3.5–5.0)
Alkaline Phosphatase: 51 U/L (ref 38–126)
Anion gap: 8 (ref 5–15)
BUN: 8 mg/dL (ref 6–20)
CO2: 23 mmol/L (ref 22–32)
CREATININE: 0.96 mg/dL (ref 0.44–1.00)
Calcium: 8.6 mg/dL — ABNORMAL LOW (ref 8.9–10.3)
Chloride: 108 mmol/L (ref 101–111)
Glucose, Bld: 124 mg/dL — ABNORMAL HIGH (ref 65–99)
POTASSIUM: 3 mmol/L — AB (ref 3.5–5.1)
SODIUM: 139 mmol/L (ref 135–145)
Total Bilirubin: 0.7 mg/dL (ref 0.3–1.2)
Total Protein: 5.9 g/dL — ABNORMAL LOW (ref 6.5–8.1)

## 2017-11-29 LAB — POCT PREGNANCY, URINE: PREG TEST UR: NEGATIVE

## 2017-11-29 LAB — LIPASE, BLOOD: Lipase: 45 U/L (ref 11–51)

## 2017-11-29 MED ORDER — DICYCLOMINE HCL 20 MG PO TABS: 20.0000 mg | ORAL_TABLET | Freq: Three times a day (TID) | ORAL | 0 refills | Status: DC | PRN

## 2017-11-29 MED ORDER — PROCHLORPERAZINE EDISYLATE 10 MG/2ML IJ SOLN
10.0000 mg | Freq: Once | INTRAMUSCULAR | Status: AC
  Administered 2017-11-29: 10 mg via INTRAVENOUS
  Filled 2017-11-29: qty 2

## 2017-11-29 MED ORDER — DICYCLOMINE HCL 10 MG/ML IM SOLN
20.0000 mg | Freq: Once | INTRAMUSCULAR | Status: AC
  Administered 2017-11-29: 20 mg via INTRAMUSCULAR
  Filled 2017-11-29 (×2): qty 2

## 2017-11-29 MED ORDER — SUCRALFATE 1 G PO TABS: 1.0000 g | ORAL_TABLET | Freq: Four times a day (QID) | ORAL | 0 refills | Status: DC

## 2017-11-29 MED ORDER — HALOPERIDOL LACTATE 5 MG/ML IJ SOLN
5.0000 mg | Freq: Once | INTRAMUSCULAR | Status: AC
  Administered 2017-11-29: 5 mg via INTRAVENOUS
  Filled 2017-11-29: qty 1

## 2017-11-29 MED ORDER — SODIUM CHLORIDE 0.9 % IV BOLUS
1000.0000 mL | Freq: Once | INTRAVENOUS | Status: AC
  Administered 2017-11-29: 1000 mL via INTRAVENOUS

## 2017-11-29 NOTE — ED Notes (Signed)
Pt assisted to the bathroom by this RN and Susy Frizzle, Charity fundraiser.

## 2017-11-29 NOTE — ED Notes (Signed)
Patient turned on call light in BR.  Found patient lying in floor in BR, covered with blanket.  Patient states she "slipped from [facility]".  Denies any injury.  Assisted to stand and get in [facility].  Taken to Room 8 via [facility], placed on stretcher, siderails up, bed in low position.  Megan RN aware of placement in room 8.

## 2017-11-29 NOTE — ED Notes (Signed)
MD to bedside to discuss [place] results.

## 2017-11-29 NOTE — ED Notes (Signed)
MD notified of patients continued complaints of nausea and pain. VORB for  Compazine and  Bentyl IM.

## 2017-11-29 NOTE — ED Notes (Signed)
This RN back to bedside due to patient calling out. Pt awakens to c/o abdominal pain and ask about status update. This RN explained that patient was asleep and this RN did not wish to disturb patient. Pt back to sleep. Visualized in NAD. VSS and WNL.

## 2017-11-29 NOTE — ED Notes (Signed)
This RN to bedside due to patient calling out, upon arrival to bedside, pt noted to be asleep. Will continue to monitor for further patient needs.

## 2017-11-29 NOTE — ED Notes (Signed)
Patient transported to Ultrasound 

## 2017-11-29 NOTE — ED Notes (Signed)
Patient's discharge and follow up information reviewed with patient by ED nursing staff and patient given the opportunity to ask questions pertaining to ED visit and discharge plan of care. Patient advised that should symptoms not continue to improve, resolve entirely, or should new symptoms develop then a follow up visit with their PCP or a return visit to the ED may be warranted. Patient verbalized consent and understanding of discharge plan of care including potential need for further evaluation. Patient discharged in stable condition per attending ED physician on duty.   Pt leaving with mother who is driving.

## 2017-11-29 NOTE — ED Notes (Signed)
Triage interrupted so patient could go to BR.

## 2017-11-29 NOTE — ED Triage Notes (Signed)
Patient here via [facility] complaining of "stomach pain in the middle of my stomach". Sx started at 0630 this AM, also complaining of "throwing up a million times".  Diarrhea "a lot".  States pain is similar to when she had kidney stones.  Color is pale, skin warm and dry.  Patient moving about in [facility].  IV placed by [facility], right AC, dry and intact.

## 2017-11-29 NOTE — ED Notes (Signed)
Pt remains sleepy however awakens easily. Pt given her personal phones to call her mother. Will continue to monitor for further patient needs.

## 2017-11-29 NOTE — ED Notes (Signed)
Pt returned from US

## 2017-11-29 NOTE — ED Notes (Signed)
MD to bedside at this time 

## 2017-11-29 NOTE — ED Notes (Signed)
High fall risk bracelet applied to patient, yellow socks, yellow fall risk sign applied to outside patient door.

## 2017-11-29 NOTE — Discharge Instructions (Addendum)
Please seek medical attention for any high fevers, chest pain, shortness of breath, change in behavior, persistent vomiting, bloody stool or any other new or concerning symptoms.  

## 2017-11-29 NOTE — ED Notes (Signed)
First Nurse Note:  Patient to ED via [facility], with lower right abdominal pain, N&V&D since 0630 this AM.  EMS reports no emesis, given 4 mg. Of Zofran IV.  O2 sat 100%, 125/74, P-80.  CBG - 121.  Patient told EMS that she donates plasma twice weekly and uses recreational marijuana.  Patient sitting in WC moaning.  To Triage.

## 2017-11-29 NOTE — ED Notes (Signed)
This RN to bedside, pt noted to have removed her EKG leads, BP cuff, and pulse ox. Pt also noted to have moved herself down in the bed. This RN encouraged patient to move back up in the bed, EKG leads placed back on patient. Pt given 2 warm blankets. Pt also c/o needing to use the bathroom, this RN explained due to patient being a high fall risk this RN would need to assist and stay in the room with patient while she was in the bathroom. Pt attempted to stand but then appeared to become unsteady on her feet, this RN assisted patient back to bed. Pt repeatedly states, "my stomach, my stomach, y'all aren't helping me." This RN explained MD in procedure at this time and would be in as soon as possible. Pt rolling around in bed and moaning.

## 2017-11-29 NOTE — ED Notes (Signed)
This RN to bedside due to patient calling out. This RN explained delay and that this RN had already spoken to [place] and delay should not be much longer. Pt rolls her eyes and closes her eyes. VSS. Will continue to monitor for further patient needs.

## 2017-11-29 NOTE — ED Notes (Signed)
Charge Nurse made aware that patient stated she fell, Neurosurgeon states she made Dr. Derrill Kay aware that patient stated she fell.

## 2017-11-29 NOTE — ED Provider Notes (Signed)
[facility] Emergency Department Provider Note   ____________________________________________   I have reviewed the triage vital signs and the nursing notes.   HISTORY  Chief Complaint Nausea; Emesis; and Diarrhea   History limited by: Not Limited   HPI April Copeland is a 32 y.o. female who presents to the emergency department today because of concern for abdominal pain, nausea and vomiting. The patient states that the symptoms started this morning.  The patient's abdominal pain is located in the right lower quadrant.  She describes as both sharp and crampy.  It has been accompanied by multiple episodes of nausea vomiting.  She has not noticed any blood in either of these.  She denies any associated fevers.  She denies any unusual ingestions recently.  States she has had similar pain in the past when she has had ovarian cyst and kidney stones.    Per medical record review patient has a history of emergency department visit for similar symptoms last year.   History reviewed. No pertinent past medical history.  There are no active problems to display for this patient.   History reviewed. No pertinent surgical history.  Prior to Admission medications   Medication Sig Start Date End Date Taking? Authorizing Provider  HYDROmorphone (DILAUDID) 2 MG tablet Take 1 tablet (2 mg total) by mouth every 6 (six) hours as needed for severe pain. 07/09/15   Jennye Moccasin, MD  ibuprofen (ADVIL,MOTRIN) 800 MG tablet Take 1 tablet (800 mg total) by mouth every 8 (eight) hours as needed. 07/09/15   Jennye Moccasin, MD  metoCLOPramide (REGLAN) 10 MG tablet Take 1 tablet (10 mg total) by mouth every 6 (six) hours as needed. 09/10/15   Schaevitz, Myra Rude, MD  ondansetron (ZOFRAN) 4 MG tablet Take 1 tablet (4 mg total) by mouth every 8 (eight) hours as needed for nausea or vomiting. 07/09/15   Jennye Moccasin, MD  Prenatal Multivit-Min-Fe-FA (PRENATAL VITAMINS) 0.8 MG  tablet Take 1 tablet by mouth daily. 09/10/15   Schaevitz, Myra Rude, MD    Allergies Patient has no known allergies.  History reviewed. No pertinent family history.  Social History Social History   Tobacco Use  . Smoking status: Current Every Day Smoker  Substance Use Topics  . Alcohol use: No  . Drug use: Yes    Types: Marijuana    Review of Systems Constitutional: No fever/chills Eyes: No visual changes. ENT: No sore throat. Cardiovascular: Denies chest pain. Respiratory: Denies shortness of breath. Gastrointestinal: Positive for abdominal pain, nausea and vomiting. Genitourinary: Negative for dysuria. Musculoskeletal: Negative for back pain. Skin: Negative for rash. Neurological: Negative for headaches, focal weakness or numbness.  ____________________________________________   PHYSICAL EXAM:  VITAL SIGNS: ED Triage Vitals  Enc Vitals Group     BP 11/29/17 0840 100/75     Pulse Rate 11/29/17 0840 80     Resp 11/29/17 0840 20     Temp 11/29/17 0840 97.8 F (36.6 C)     Temp Source 11/29/17 0840 Oral     SpO2 11/29/17 0840 100 %     Weight 11/29/17 0841 155 lb (70.3 kg)     Height 11/29/17 0841  (1.626 m)     Head Circumference --      Peak Flow --      Pain Score 11/29/17 0850 10   Constitutional: Alert and oriented.  Eyes: Conjunctivae are normal.  ENT   Head: Normocephalic and atraumatic.   Nose: No congestion/rhinnorhea.  Mouth/Throat: Mucous membranes are moist.   Neck: No stridor. Hematological/Lymphatic/Immunilogical: No cervical lymphadenopathy. Cardiovascular: Normal rate, regular rhythm.  No murmurs, rubs, or gallops.  Respiratory: Normal respiratory effort without tachypnea nor retractions. Breath sounds are clear and equal bilaterally. No wheezes/rales/rhonchi. Gastrointestinal: Soft and tender to palpation in the right lower quadrant and right upper quadrant. No rebound. No guarding.  Genitourinary:  Deferred Musculoskeletal: Normal range of motion in all extremities. No lower extremity edema. Neurologic:  Normal speech and language. No gross focal neurologic deficits are appreciated.  Skin:  Skin is warm, dry and intact. No rash noted. Psychiatric: Mood and affect are normal. Speech and behavior are normal. Patient exhibits appropriate insight and judgment.  ____________________________________________    LABS (pertinent positives/negatives)  Lipase 45 CBC wnl except rdw 15.8 CMP k 3.0, glu 124, cr 0.96  ____________________________________________   EKG  None  ____________________________________________    RADIOLOGY  US pelvis Prominent follicle in the right ovary  ____________________________________________   PROCEDURES  Procedures  ____________________________________________   INITIAL IMPRESSION / ASSESSMENT AND PLAN / ED COURSE  Pertinent labs & imaging results that were available during my care of the patient were reviewed by me and considered in my medical decision making (see chart for details).  Patient presented to the emergency department today because of concerns for abdominal pain.  Differential be broad including gastroenteritis, food poisoning, appendicitis, gallbladder disease, ovarian pathology amongst other etiologies.  Patient did not have any fever or leukocytosis.  Patient has had similar presentations in the past.  Ultrasound did show prominent follicle of the right ovary.  Patient did feel better after medication.  At this point I doubt appendicitis.  Discussed findings with patient.  Will plan on discharging with medication.  ____________________________________________   FINAL CLINICAL IMPRESSION(S) / ED DIAGNOSES  Final diagnoses:  Adnexal pain     Note: This dictation was prepared with Dragon dictation. Any transcriptional errors that result from this process are unintentional     Phineas Semen, MD 11/29/17 1530

## 2018-01-28 ENCOUNTER — Inpatient Hospital Stay
Admission: EM | Admit: 2018-01-28 | Discharge: 2018-01-29 | DRG: 391 | Disposition: A | Discharge status: Home / Self Care | Payer: Medicaid Other | Attending: Internal Medicine | Admitting: Internal Medicine

## 2018-01-28 ENCOUNTER — Emergency Department: Payer: Medicaid Other

## 2018-01-28 ENCOUNTER — Encounter: Payer: Self-pay | Admitting: Radiology

## 2018-01-28 DIAGNOSIS — R197 Diarrhea, unspecified: Secondary | ICD-10-CM

## 2018-01-28 DIAGNOSIS — K529 Noninfective gastroenteritis and colitis, unspecified: Secondary | ICD-10-CM | POA: Diagnosis present

## 2018-01-28 DIAGNOSIS — F17200 Nicotine dependence, unspecified, uncomplicated: Secondary | ICD-10-CM | POA: Diagnosis present

## 2018-01-28 DIAGNOSIS — G9341 Metabolic encephalopathy: Secondary | ICD-10-CM | POA: Diagnosis present

## 2018-01-28 DIAGNOSIS — F1220 Cannabis dependence, uncomplicated: Secondary | ICD-10-CM | POA: Diagnosis present

## 2018-01-28 DIAGNOSIS — R112 Nausea with vomiting, unspecified: Secondary | ICD-10-CM

## 2018-01-28 LAB — CBC WITH DIFFERENTIAL/PLATELET
Basophils Absolute: 0 10*3/uL (ref 0–0.1)
Basophils Relative: 1 %
EOS PCT: 1 %
Eosinophils Absolute: 0 10*3/uL (ref 0–0.7)
HCT: 37.9 % (ref 35.0–47.0)
Hemoglobin: 12.4 g/dL (ref 12.0–16.0)
LYMPHS PCT: 16 %
Lymphs Abs: 0.8 10*3/uL — ABNORMAL LOW (ref 1.0–3.6)
MCH: 26.8 pg (ref 26.0–34.0)
MCHC: 32.7 g/dL (ref 32.0–36.0)
MCV: 81.9 fL (ref 80.0–100.0)
MONO ABS: 0.4 10*3/uL (ref 0.2–0.9)
MONOS PCT: 8 %
Neutro Abs: 3.7 10*3/uL (ref 1.4–6.5)
Neutrophils Relative %: 74 %
PLATELETS: 183 10*3/uL (ref 150–440)
RBC: 4.63 MIL/uL (ref 3.80–5.20)
RDW: 16 % — ABNORMAL HIGH (ref 11.5–14.5)
WBC: 5 10*3/uL (ref 3.6–11.0)

## 2018-01-28 LAB — URINALYSIS, COMPLETE (UACMP) WITH MICROSCOPIC
BILIRUBIN URINE: NEGATIVE
Glucose, UA: NEGATIVE mg/dL
Hgb urine dipstick: NEGATIVE
Ketones, ur: 80 mg/dL — AB
LEUKOCYTES UA: NEGATIVE
Nitrite: NEGATIVE
Protein, ur: 30 mg/dL — AB
SPECIFIC GRAVITY, URINE: 1.026 (ref 1.005–1.030)
pH: 6 (ref 5.0–8.0)

## 2018-01-28 LAB — COMPREHENSIVE METABOLIC PANEL
ALBUMIN: 4.3 g/dL (ref 3.5–5.0)
ALT: 14 U/L (ref 0–44)
AST: 40 U/L (ref 15–41)
Alkaline Phosphatase: 54 U/L (ref 38–126)
Anion gap: 15 (ref 5–15)
BUN: 11 mg/dL (ref 6–20)
CO2: 19 mmol/L — ABNORMAL LOW (ref 22–32)
Calcium: 8.9 mg/dL (ref 8.9–10.3)
Chloride: 106 mmol/L (ref 98–111)
Creatinine, Ser: 0.83 mg/dL (ref 0.44–1.00)
GFR calc Af Amer: >60 mL/min (ref >60)
GFR calc non Af Amer: >60 mL/min (ref >60)
GLUCOSE: 84 mg/dL (ref 70–99)
POTASSIUM: 3.6 mmol/L (ref 3.5–5.1)
Sodium: 140 mmol/L (ref 135–145)
Total Bilirubin: 0.9 mg/dL (ref 0.3–1.2)
Total Protein: 7.1 g/dL (ref 6.5–8.1)

## 2018-01-28 LAB — URINE DRUG SCREEN, QUALITATIVE (ARMC ONLY)
Amphetamines, Ur Screen: NOT DETECTED
Benzodiazepine, Ur Scrn: POSITIVE — AB
COCAINE METABOLITE, UR ~~LOC~~: NOT DETECTED
Cannabinoid 50 Ng, Ur ~~LOC~~: POSITIVE — AB
MDMA (Ecstasy)Ur Screen: NOT DETECTED
METHADONE SCREEN, URINE: NOT DETECTED
Opiate, Ur Screen: NOT DETECTED
Phencyclidine (PCP) Ur S: NOT DETECTED
TRICYCLIC, UR SCREEN: NOT DETECTED

## 2018-01-28 LAB — POC URINE PREG, ED
Preg Test, Ur: NEGATIVE
Preg Test, Ur: NEGATIVE

## 2018-01-28 LAB — LIPASE, BLOOD: Lipase: 34 U/L (ref 11–51)

## 2018-01-28 MED ORDER — KETOROLAC TROMETHAMINE 30 MG/ML IJ SOLN
30.0000 mg | Freq: Once | INTRAMUSCULAR | Status: AC
  Administered 2018-01-28: 30 mg via INTRAVENOUS
  Filled 2018-01-28: qty 1

## 2018-01-28 MED ORDER — HYDROCODONE-ACETAMINOPHEN 5-325 MG PO TABS
1.0000 | ORAL_TABLET | ORAL | Status: DC | PRN
  Administered 2018-01-29: 2 via ORAL
  Filled 2018-01-28: qty 2

## 2018-01-28 MED ORDER — ACETAMINOPHEN 325 MG PO TABS
650.0000 mg | ORAL_TABLET | Freq: Four times a day (QID) | ORAL | Status: DC | PRN
  Administered 2018-01-29: 650 mg via ORAL
  Filled 2018-01-28: qty 2

## 2018-01-28 MED ORDER — ONDANSETRON HCL 4 MG PO TABS
4.0000 mg | ORAL_TABLET | Freq: Four times a day (QID) | ORAL | Status: DC | PRN
  Administered 2018-01-29: 4 mg via ORAL
  Filled 2018-01-28: qty 1

## 2018-01-28 MED ORDER — METOCLOPRAMIDE HCL 5 MG/ML IJ SOLN
10.0000 mg | Freq: Once | INTRAMUSCULAR | Status: AC
  Administered 2018-01-28: 10 mg via INTRAVENOUS
  Filled 2018-01-28: qty 2

## 2018-01-28 MED ORDER — CIPROFLOXACIN IN D5W 400 MG/200ML IV SOLN
400.0000 mg | Freq: Two times a day (BID) | INTRAVENOUS | Status: DC
  Filled 2018-01-28 (×2): qty 200

## 2018-01-28 MED ORDER — LORAZEPAM 2 MG/ML IJ SOLN
1.0000 mg | Freq: Once | INTRAMUSCULAR | Status: AC
  Administered 2018-01-28: 1 mg via INTRAVENOUS
  Filled 2018-01-28: qty 1

## 2018-01-28 MED ORDER — CIPROFLOXACIN IN D5W 400 MG/200ML IV SOLN
400.0000 mg | Freq: Once | INTRAVENOUS | Status: AC
  Administered 2018-01-28: 400 mg via INTRAVENOUS
  Filled 2018-01-28: qty 200

## 2018-01-28 MED ORDER — SODIUM CHLORIDE 0.9 % IV SOLN
INTRAVENOUS | Status: DC
  Administered 2018-01-28: via INTRAVENOUS

## 2018-01-28 MED ORDER — LORAZEPAM 2 MG/ML IJ SOLN
1.0000 mg | Freq: Once | INTRAMUSCULAR | Status: AC
Start: 2018-01-28 — End: 2018-01-28
  Administered 2018-01-28: 1 mg via INTRAVENOUS
  Filled 2018-01-28: qty 1

## 2018-01-28 MED ORDER — HALOPERIDOL LACTATE 5 MG/ML IJ SOLN
2.5000 mg | Freq: Once | INTRAMUSCULAR | Status: AC
  Administered 2018-01-29: 2.5 mg via INTRAVENOUS
  Filled 2018-01-28: qty 1

## 2018-01-28 MED ORDER — METRONIDAZOLE IN NACL 5-0.79 MG/ML-% IV SOLN
500.0000 mg | Freq: Once | INTRAVENOUS | Status: AC
  Administered 2018-01-29: 500 mg via INTRAVENOUS
  Filled 2018-01-28: qty 100

## 2018-01-28 MED ORDER — TRAZODONE HCL 50 MG PO TABS: 25.0000 mg | ORAL_TABLET | Freq: Every evening | ORAL | Status: DC | PRN

## 2018-01-28 MED ORDER — DICYCLOMINE HCL 10 MG/ML IM SOLN
20.0000 mg | Freq: Once | INTRAMUSCULAR | Status: AC
  Administered 2018-01-28: 20 mg via INTRAMUSCULAR
  Filled 2018-01-28: qty 2

## 2018-01-28 MED ORDER — ONDANSETRON HCL 4 MG/2ML IJ SOLN
4.0000 mg | Freq: Once | INTRAMUSCULAR | Status: AC
  Administered 2018-01-28: 4 mg via INTRAVENOUS
  Filled 2018-01-28: qty 2

## 2018-01-28 MED ORDER — HEPARIN SODIUM (PORCINE) 5000 UNIT/ML IJ SOLN
5000.0000 [IU] | Freq: Three times a day (TID) | INTRAMUSCULAR | Status: DC
  Administered 2018-01-29: 5000 [IU] via SUBCUTANEOUS
  Filled 2018-01-28: qty 1

## 2018-01-28 MED ORDER — BISACODYL 5 MG PO TBEC: 5.0000 mg | DELAYED_RELEASE_TABLET | Freq: Every day | ORAL | Status: DC | PRN

## 2018-01-28 MED ORDER — SODIUM CHLORIDE 0.9 % IV BOLUS
1000.0000 mL | Freq: Once | INTRAVENOUS | Status: AC
  Administered 2018-01-28: 1000 mL via INTRAVENOUS

## 2018-01-28 MED ORDER — IOPAMIDOL (ISOVUE-300) INJECTION 61%
100.0000 mL | Freq: Once | INTRAVENOUS | Status: AC | PRN
  Administered 2018-01-28: 100 mL via INTRAVENOUS

## 2018-01-28 MED ORDER — ACETAMINOPHEN 650 MG RE SUPP: 650.0000 mg | Freq: Four times a day (QID) | RECTAL | Status: DC | PRN

## 2018-01-28 MED ORDER — DOCUSATE SODIUM 100 MG PO CAPS
100.0000 mg | ORAL_CAPSULE | Freq: Two times a day (BID) | ORAL | Status: DC
  Filled 2018-01-28: qty 1

## 2018-01-28 MED ORDER — METRONIDAZOLE IN NACL 5-0.79 MG/ML-% IV SOLN
500.0000 mg | Freq: Three times a day (TID) | INTRAVENOUS | Status: DC
  Administered 2018-01-29: 500 mg via INTRAVENOUS
  Filled 2018-01-28 (×4): qty 100

## 2018-01-28 MED ORDER — ONDANSETRON HCL 4 MG/2ML IJ SOLN
4.0000 mg | Freq: Four times a day (QID) | INTRAMUSCULAR | Status: DC | PRN
  Administered 2018-01-29: 4 mg via INTRAVENOUS
  Filled 2018-01-28: qty 2

## 2018-01-28 MED ORDER — HALOPERIDOL LACTATE 5 MG/ML IJ SOLN
INTRAMUSCULAR | Status: AC
  Filled 2018-01-28: qty 1

## 2018-01-28 NOTE — ED Notes (Signed)
ED Provider at bedside. 

## 2018-01-28 NOTE — ED Notes (Signed)
CT called and informed that the patient had a negative POC and can be taken to radiology.

## 2018-01-28 NOTE — ED Notes (Signed)
Pt found over by the toilet, stumbling and pulling her IV pole from across the room.  Patient unstable at this time and has incoherent speech.  Patient assisted back to the bed and informed that she needs to stay in the bed and hit the call bell if she needs any assistance.

## 2018-01-28 NOTE — ED Notes (Signed)
Safety sitter at bedside 

## 2018-01-28 NOTE — H&P (Addendum)
[facility]  at [facility]   PATIENT NAME: April DoryCiera Copeland    MR#:  [id number]  DATE OF BIRTH:  [birth year]  DATE OF ADMISSION:  01/28/2018  PRIMARY CARE PHYSICIAN: [facility]   REQUESTING/REFERRING PHYSICIAN:   CHIEF COMPLAINT:   Chief Complaint  Patient presents with  . Diarrhea    HISTORY OF PRESENT ILLNESS: April DoryCiera Joslin  is a 32 y.o. female with a known history of tobacco abuse and irritable bowel syndrome. Patient presented to emergency room for acute onset of diffuse lower abdominal pain and watery diarrhea associated with 2 episodes of vomiting going on for the past 24 hours, gradually getting worse.  She denies any fever or chills, no bleeding.  She took some Dulcolax 2 days ago for constipation.  Patient also admits for taking 3 muscle relaxant pills from her son, just before arrival to emergency room.  She is currently very drowsy and agitated, after receiving 2 mg IV Ativan in the emergency room for her agitation.  Patient denies recent antibiotic use. Blood test done emergency room, including CBC and CMP are grossly unremarkable.  Urine drug screen test is positive for benzodiazepines and marijuana. Abdominal CAT scan, reviewed by myself, shows mild diffuse colitis. Patient is admitted for further evaluation and treatment.   PAST MEDICAL HISTORY:  History reviewed. No pertinent past medical history.  PAST SURGICAL HISTORY: No surgeries.  SOCIAL HISTORY:  Social History   Tobacco Use  . Smoking status: Current Every Day Smoker  Substance Use Topics  . Alcohol use: No    FAMILY HISTORY: Hypertension in both parents.  DRUG ALLERGIES: No Known Allergies  REVIEW OF SYSTEMS:   CONSTITUTIONAL: No fever, but patient complains of fatigue and generalized weakness.  EYES: No blurred or double vision.  EARS, NOSE, AND THROAT: No tinnitus or ear pain.  RESPIRATORY: No cough, shortness of breath, wheezing or hemoptysis.   CARDIOVASCULAR: No chest pain, orthopnea, edema.  GASTROINTESTINAL: Positive for nausea, vomiting, diarrhea and abdominal pain.  GENITOURINARY: No dysuria, hematuria.  ENDOCRINE: No polyuria, nocturia,  HEMATOLOGY: No anemia, easy bruising or bleeding SKIN: No rash or lesion. MUSCULOSKELETAL: No joint pain or arthritis.   NEUROLOGIC: No tingling, numbness, weakness.  PSYCHIATRY: Positive for anxiety, agitation.  MEDICATIONS AT HOME:  Prior to Admission medications   Medication Sig Start Date End Date Taking? Authorizing Provider  dicyclomine (BENTYL) 20 MG tablet Take 1 tablet (20 mg total) by mouth 3 (three) times daily as needed (abdominal pain). Patient not taking: Reported on 01/28/2018 11/29/17   Phineas SemenGoodman, Graydon, MD  sucralfate (CARAFATE) 1 g tablet Take 1 tablet (1 g total) by mouth 4 (four) times daily. Patient not taking: Reported on 01/28/2018 11/29/17   Phineas SemenGoodman, Graydon, MD      PHYSICAL EXAMINATION:   VITAL SIGNS: Blood pressure (!) 119/94, pulse 61, temperature 98 F (36.7 C), temperature source Oral, resp. rate 20, height 5\' 5"  (1.651 m), weight 68 kg (150 lb), last menstrual period 01/20/2018, SpO2 100 %.  GENERAL:  32 y.o.-year-old patient lying in the bed with moderate distress, secondary to abdominal pain.  Patient is very restless, drowsy, agitated EYES: Pupils equal, round, reactive to light and accommodation. No scleral icterus. Extraocular muscles intact.  HEENT: Head atraumatic, normocephalic. Oropharynx and nasopharynx clear.  NECK:  Supple, no jugular venous distention. No thyroid enlargement, no tenderness.  LUNGS: Normal breath sounds bilaterally, no wheezing, rales,rhonchi or crepitation. No use of accessory muscles of respiration.  CARDIOVASCULAR: S1, S2  normal. No murmurs, rubs, or gallops.  ABDOMEN: There is diffuse abdominal tenderness with palpation in the lower abdomen.  Otherwise, abdomen is soft, nondistended. Bowel sounds present. No organomegaly or  mass.  EXTREMITIES: No pedal edema, cyanosis, or clubbing.  NEUROLOGIC: Cranial nerves II through XII are intact. Muscle strength 5/5 in all extremities. Sensation intact. PSYCHIATRIC: The patient is alert and oriented x 3, but drowsy and restless.  SKIN: No obvious rash, lesion, or ulcer.   LABORATORY PANEL:   CBC Recent Labs  Lab 01/28/18 1553  WBC 5.0  HGB 12.4  HCT 37.9  PLT 183  MCV 81.9  MCH 26.8  MCHC 32.7  RDW 16.0*  LYMPHSABS 0.8*  MONOABS 0.4  EOSABS 0.0  BASOSABS 0.0   ------------------------------------------------------------------------------------------------------------------  Chemistries  Recent Labs  Lab 01/28/18 1553  NA 140  K 3.6  CL 106  CO2 19*  GLUCOSE 84  BUN 11  CREATININE 0.83  CALCIUM 8.9  AST 40  ALT 14  ALKPHOS 54  BILITOT 0.9   ------------------------------------------------------------------------------------------------------------------ estimated creatinine clearance is 86.7 mL/min (by C-G formula based on SCr of 0.83 mg/dL). ------------------------------------------------------------------------------------------------------------------ No results for input(s): TSH, T4TOTAL, T3FREE, THYROIDAB in the last 72 hours.  Invalid input(s): FREET3   Coagulation profile No results for input(s): INR, PROTIME in the last 168 hours. ------------------------------------------------------------------------------------------------------------------- No results for input(s): DDIMER in the last 72 hours. -------------------------------------------------------------------------------------------------------------------  Cardiac Enzymes No results for input(s): CKMB, TROPONINI, MYOGLOBIN in the last 168 hours.  Invalid input(s): CK ------------------------------------------------------------------------------------------------------------------ Invalid input(s):  POCBNP  ---------------------------------------------------------------------------------------------------------------  Urinalysis    Component Value Date/Time   COLORURINE YELLOW (A) 01/28/2018 2024   APPEARANCEUR CLEAR (A) 01/28/2018 2024   LABSPEC 1.026 01/28/2018 2024   PHURINE 6.0 01/28/2018 2024   GLUCOSEU NEGATIVE 01/28/2018 2024   HGBUR NEGATIVE 01/28/2018 2024   BILIRUBINUR NEGATIVE 01/28/2018 2024   KETONESUR 80 (A) 01/28/2018 2024   PROTEINUR 30 (A) 01/28/2018 2024   NITRITE NEGATIVE 01/28/2018 2024   LEUKOCYTESUR NEGATIVE 01/28/2018 2024     RADIOLOGY: Ct Abdomen Pelvis W Contrast  Result Date: 01/28/2018 CLINICAL DATA:  Abdominal pain, nausea and vomiting, and diarrhea for 3 days. Altered mental status. EXAM: CT ABDOMEN AND PELVIS WITH CONTRAST TECHNIQUE: Multidetector CT imaging of the abdomen and pelvis was performed using the standard protocol following bolus administration of intravenous contrast. CONTRAST:  ISOVUE-300 IOPAMIDOL (ISOVUE-300) INJECTION 61% COMPARISON:  None. FINDINGS: Lower Chest: No acute findings. Hepatobiliary:  No hepatic masses identified. Pancreas:  No mass or inflammatory changes. Spleen: Within normal limits in size and appearance. Adrenals/Urinary Tract: No masses identified. No evidence of hydronephrosis. Stomach/Bowel: No evidence of bowel obstruction. Although the colon is nondistended, there is the suggestion of mild diffuse wall thickening and mucosal enhancement, suspicious for colitis. Small amount of free fluid is seen in the pelvic cul-de-sac. Normal appendix. No evidence of abscess. Vascular/Lymphatic: No pathologically enlarged lymph nodes. No abdominal aortic aneurysm. Reproductive:  No mass or other significant abnormality. Other:  None. Musculoskeletal:  No suspicious bone lesions identified. IMPRESSION: Possible mild diffuse colitis. Small amount of free pelvic fluid.  No evidence of abscess. Electronically Signed   By: Myles Rosenthal M.D.   On: 01/28/2018 21:01    EKG: No orders found for this or any previous visit.  IMPRESSION AND PLAN:  1.  Acute colitis.  We will start IV Cipro and Flagyl and IV fluids.  We will keep patient n.p.o. for now.  Will check stool studies. 2.  Altered mental  status.  Patient is drowsy and restless.  This is likely due to combination of muscle relaxants, that patient took at home and Ativan that she received in ER.  We will continue to monitor clinically closely, continue IV fluids. 3.  Tobacco abuse.  Smoking cessation was discussed with patient. 4.  Marijuana abuse.  Cessation was discussed with patient.  All the records are reviewed and case discussed with ED provider. Management plans discussed with the patient, who is in agreement.  CODE STATUS: Full    TOTAL TIME TAKING CARE OF THIS PATIENT: 45 minutes.    Cammy Copa M.D on 01/28/2018 at 10:57 PM  Between 7am to 6pm - Pager - [phone]  After 6pm go to [email or web address] - password [email or web address] [facility]  [facility]  Office  [phone]  CC: Primary care physician; [facility]

## 2018-01-28 NOTE — ED Notes (Signed)
Pt provided with scrubs and warm wipes.

## 2018-01-28 NOTE — ED Notes (Addendum)
Patient's family has left bedside at this time, and sitter placed back as a 1:1 Recruitment consultantsafety sitter.

## 2018-01-28 NOTE — ED Triage Notes (Addendum)
Pt presents via [facility] from home. Pt called for uncontrolled diarrhea after taking 3 duxolac for constipation. Pt was talking at residence to family memebers per ems then once they arrived she through she would not respond. Pt took some of sons muscle relaxer last night.

## 2018-01-28 NOTE — ED Notes (Signed)
Patient transported to CT 

## 2018-01-28 NOTE — ED Provider Notes (Signed)
[facility] Emergency Department Provider Note ____________________________________________   First MD Initiated Contact with Patient 01/28/18 1528     (approximate)  I have reviewed the triage vital signs and the nursing notes.   HISTORY  Chief Complaint Diarrhea    HPI Kerry DoryCiera Fluegge is a 32 y.o. female with PMH as noted below who presents with lower abdominal pain, acute onset this morning, persistent course since then, and associated with 2 episodes of vomiting as well as multiple episodes of watery diarrhea.  Patient reports that this pain is similar to prior episodes that she has had that brought her to the emergency department.  She also reports generalized weakness, and feels like her hands and face are cramping up and getting stiff.   History reviewed. No pertinent past medical history.  There are no active problems to display for this patient.   No past surgical history on file.  Prior to Admission medications   Medication Sig Start Date End Date Taking? Authorizing Provider  dicyclomine (BENTYL) 20 MG tablet Take 1 tablet (20 mg total) by mouth 3 (three) times daily as needed (abdominal pain). Patient not taking: Reported on 01/28/2018 11/29/17   Phineas SemenGoodman, Graydon, MD  sucralfate (CARAFATE) 1 g tablet Take 1 tablet (1 g total) by mouth 4 (four) times daily. Patient not taking: Reported on 01/28/2018 11/29/17   Phineas SemenGoodman, Graydon, MD    Allergies Patient has no known allergies.  No family history on file.  Social History Social History   Tobacco Use  . Smoking status: Current Every Day Smoker  Substance Use Topics  . Alcohol use: No  . Drug use: Yes    Types: Marijuana    Review of Systems  Constitutional: No fever. Eyes: No redness. ENT: No sore throat. Cardiovascular: Denies chest pain. Respiratory: Denies shortness of breath. Gastrointestinal: Positive for vomiting and diarrhea. Genitourinary: Negative for dysuria.    Musculoskeletal: Negative for back pain. Skin: Negative for rash. Neurological: Negative for headache.   ____________________________________________   PHYSICAL EXAM:  VITAL SIGNS: ED Triage Vitals  Enc Vitals Group     BP      Pulse      Resp      Temp      Temp src      SpO2      Weight      Height      Head Circumference      Peak Flow      Pain Score      Pain Loc      Pain Edu?      Excl. in GC?     Constitutional: Alert and oriented.  Uncomfortable appearing but in no acute distress.   Eyes: Conjunctivae are normal.  No scleral icterus. Head: Atraumatic. Nose: No congestion/rhinnorhea. Mouth/Throat: Mucous membranes are dry.   Neck: Normal range of motion.  Cardiovascular:  Good peripheral circulation. Respiratory: Normal respiratory effort.  No retractions.  Gastrointestinal: Soft with mild bilateral lower quadrant tenderness and diffuse discomfort to palpation. No distention.  Genitourinary: No flank tenderness. Musculoskeletal:  Extremities warm and well perfused.  Neurologic:  Normal speech and language.  Motor intact in all extremities.  No facial droop.  No gross focal neurologic deficits are appreciated.  Skin:  Skin is warm and dry. No rash noted. Psychiatric: Anxious appearing.  ____________________________________________   LABS (all labs ordered are listed, but only abnormal results are displayed)  Labs Reviewed  COMPREHENSIVE METABOLIC PANEL - Abnormal; Notable for the  following components:      Result Value   CO2 19 (*)    All other components within normal limits  CBC WITH DIFFERENTIAL/PLATELET - Abnormal; Notable for the following components:   RDW 16.0 (*)    Lymphs Abs 0.8 (*)    All other components within normal limits  URINALYSIS, COMPLETE (UACMP) WITH MICROSCOPIC - Abnormal; Notable for the following components:   Color, Urine YELLOW (*)    APPearance CLEAR (*)    Ketones, ur 80 (*)    Protein, ur 30 (*)    Bacteria, UA RARE  (*)    All other components within normal limits  URINE DRUG SCREEN, QUALITATIVE ([facility] ONLY) - Abnormal; Notable for the following components:   Cannabinoid 50 Ng, Ur Elmer POSITIVE (*)    Barbiturates, Ur Screen   (*)    Value: Result not available. Reagent lot number recalled by manufacturer.   Benzodiazepine, Ur Scrn POSITIVE (*)    All other components within normal limits  LIPASE, BLOOD  POC URINE PREG, ED   ____________________________________________  EKG   ____________________________________________  RADIOLOGY    ____________________________________________   PROCEDURES  Procedure(s) performed: No  Procedures  Critical Care performed: No ____________________________________________   INITIAL IMPRESSION / ASSESSMENT AND PLAN / ED COURSE  Pertinent labs & imaging results that were available during my care of the patient were reviewed by me and considered in my medical decision making (see chart for details).  32 year old female with PMH as noted above presents with lower abdominal pain since this morning, associated with 2 episodes of vomiting and multiple episodes of watery diarrhea.  Patient also reports that she feels tingling in her hands and legs and feels like her hands and face are cramping up and becoming stiff.  I reviewed the past medical records in Epic; the patient has had multiple prior ED visits both here and at [facility] for lower abdominal pain with generally negative work-ups except for right-sided ovarian cyst.  Patient herself states that the symptoms are similar to her last ED visit here in May.  On exam, the patient is anxious and uncomfortable appearing, she does have some lower abdominal mild tenderness and diffuse mild discomfort to palpation, and the remainder of the exam is as described above.  She has no focal neurologic findings.  Per EMS, the patient collapsed on the floor when they arrived although she was fully alert and talkative  during the ride over and remained so during my assessment in the ED.  She reports some neurologic symptoms but has no objective neurologic findings and the symptoms  she is describing are consistent with cramping and paresthesias related to her hyperventilation.  Overall the presentation is consistent with exacerbation of her chronic abdominal pain, possibly related to ovarian cyst, versus acute gastroenteritis or gastritis.  Based on the current assessment and the prior history of similar presentations I have a low suspicion for appendicitis, colitis, or acute gynecologic cause such as torsion.  We will obtain labs, treat symptomatically for pain and nausea and give fluids, and reassess.  Consider imaging if persistent severe pain or concerning lab findings.  ----------------------------------------- 6:09 PM on 01/28/2018 -----------------------------------------  Patient's lab work-up is unremarkable.  UA is still pending.  On reassessment, the patient continued to be uncomfortable appearing and very anxious and hyperventilating.  I gave 1 mg of Ativan with no significant improvement.  A second milligram was given.  The patient now appears somewhat sleepy and confused and is slurring  her speech slightly but is able to answer some questions.  This is consistent with the effects of Ativan.  The patient briefly appeared more agitated I considered giving Haldol, however on reassessment the patient is redirectable and calm.  She reports some improvement in the pain.  Given the persistent pain after initial medications, as well as patient's recurrent presentations and the fact that she has not had CT in approximately 1 year, I ordered a CT to rule out acute intra-abdominal causes.  We will continue to observe closely.  ----------------------------------------- 9:19 PM on 01/28/2018 -----------------------------------------  Patient is remained calm and relatively comfortable.  CT shows mild diffuse  colitis but no other acute findings.  UA is negative for signs of infection but UDS is positive for cannabinoids and benzos.  Given the history of the patient having taken some of her son's muscle relaxant, it is possible that this may have potentiated the effect of Ativan given for her anxiety here.  I will continue to observe the patient.  Given that she has normal labs and stable vital signs with no evidence of sepsis it is possible that she may be able to be discharged home with oral antibiotics if she returns to her baseline mental status over the next few hours.  If she continues to be weak or have severe pain, I will admit.  ----------------------------------------- 10:30 PM on 01/28/2018 -----------------------------------------  Patient was sleeping comfortably for some time.  She now is more awake and states that her pain is back although she appears more comfortable than she did earlier.  Given the persistent pain I obtained a CT which actually shows evidence of colitis which she has not been seen previously on her imaging.  Given the persistent pain and the fact that the patient has not really been tolerating p.o. as well as this finding, I feel that she is more appropriate for admission.  The patient agrees with this plan.  I signed the patient out to the hospitalist Dr. Caryn Bee. ____________________________________________   FINAL CLINICAL IMPRESSION(S) / ED DIAGNOSES  Final diagnoses:  Colitis  Nausea vomiting and diarrhea      NEW MEDICATIONS STARTED DURING THIS VISIT:  New Prescriptions   No medications on file     Note:  This document was prepared using Dragon voice recognition software and may include unintentional dictation errors.    Dionne Bucy, MD 01/28/18 2231

## 2018-01-28 NOTE — ED Notes (Addendum)
Pt is climbing out of bed when staff in room pt is has encoherent speach and , once we leave room the pt then yells to us in the hallway the pt speech was  slurred, but understandable. EDP aware at bedside.

## 2018-01-28 NOTE — ED Notes (Signed)
Pt began vomiting over side rail despite that a emesis bag was given to her.Rn gave the emesis bag to pt.

## 2018-01-28 NOTE — ED Notes (Signed)
Patient found climbing over the side rails of her stretcher.  Patient asked where she was going, and she could not give an answer at this time.  Patient still unclear in speech and has incomprehensible communication at this time.  MD made aware that patient needs a Recruitment consultantsafety sitter.

## 2018-01-29 LAB — BASIC METABOLIC PANEL
ANION GAP: 10 (ref 5–15)
BUN: 10 mg/dL (ref 6–20)
CALCIUM: 8.2 mg/dL — AB (ref 8.9–10.3)
CO2: 19 mmol/L — ABNORMAL LOW (ref 22–32)
Chloride: 112 mmol/L — ABNORMAL HIGH (ref 98–111)
Creatinine, Ser: 0.77 mg/dL (ref 0.44–1.00)
GFR calc Af Amer: >60 mL/min (ref >60)
Glucose, Bld: 79 mg/dL (ref 70–99)
Potassium: 3.2 mmol/L — ABNORMAL LOW (ref 3.5–5.1)
SODIUM: 141 mmol/L (ref 135–145)

## 2018-01-29 LAB — GLUCOSE, CAPILLARY: GLUCOSE-CAPILLARY: 79 mg/dL (ref 70–99)

## 2018-01-29 LAB — CBC
HCT: 34.8 % — ABNORMAL LOW (ref 35.0–47.0)
HEMOGLOBIN: 11.8 g/dL — AB (ref 12.0–16.0)
MCH: 27.6 pg (ref 26.0–34.0)
MCHC: 33.7 g/dL (ref 32.0–36.0)
MCV: 81.7 fL (ref 80.0–100.0)
Platelets: 139 10*3/uL — ABNORMAL LOW (ref 150–440)
RBC: 4.27 MIL/uL (ref 3.80–5.20)
RDW: 16.1 % — ABNORMAL HIGH (ref 11.5–14.5)
WBC: 5.8 10*3/uL (ref 3.6–11.0)

## 2018-01-29 MED ORDER — POTASSIUM CHLORIDE CRYS ER 20 MEQ PO TBCR
40.0000 meq | EXTENDED_RELEASE_TABLET | Freq: Once | ORAL | Status: AC
  Administered 2018-01-29: 40 meq via ORAL
  Filled 2018-01-29: qty 2

## 2018-01-29 MED ORDER — PROBIOTIC ACIDOPHILUS PO CAPS: 1.0000 | ORAL_CAPSULE | Freq: Two times a day (BID) | ORAL | 0 refills | Status: AC

## 2018-01-29 MED ORDER — CIPROFLOXACIN HCL 500 MG PO TABS: 500.0000 mg | ORAL_TABLET | Freq: Two times a day (BID) | ORAL | 0 refills | Status: AC

## 2018-01-29 MED ORDER — METRONIDAZOLE 500 MG PO TABS: 500.0000 mg | ORAL_TABLET | Freq: Three times a day (TID) | ORAL | 0 refills | Status: AC

## 2018-01-29 NOTE — Plan of Care (Signed)

## 2018-01-29 NOTE — Progress Notes (Signed)
Patient was discharged to home today. Boyfriend came to take home. DC & RX instructions given and patient acknowledged understanding. Abx instructions given and patient said she would take entire course of medication. IV removed.

## 2018-01-29 NOTE — Discharge Summary (Signed)
[employer] [facility] [place] [facility]   PATIENT NAME: April Copeland    MR#:  [id number]  DATE OF BIRTH:  11-13-1985  DATE OF ADMISSION:  01/28/2018 ADMITTING PHYSICIAN: Cammy Copa, MD  DATE OF DISCHARGE: 01/29/2018  PRIMARY CARE PHYSICIAN: [facility]    ADMISSION DIAGNOSIS:  Colitis [K52.9] Nausea vomiting and diarrhea [R11.2, R19.7]  DISCHARGE DIAGNOSIS:  Active Problems:   Acute colitis   SECONDARY DIAGNOSIS:  Marijuana dependence Tobacco dependence  HOSPITAL COURSE:   32 year old female with history of tobacco dependence and irritable bowel syndrome who presented to the emergency room with lower abdominal pain and diarrhea.  1. Acute colitis on CT scan:Diarrhea has improved. She will be discharged on oral Cipro and Flagyl.  2. Tobacco dependence: Patient is encouraged to quit smoking. Counseling was provided for 4 minutes.   3 Acute metabolic encephalopathy: Her mental status has improved to baseline.  4.  Marijuana abuse: Patient is encouraged to stop using illicit drugs. DISCHARGE CONDITIONS AND DIET:   Stable for discharge on regular diet  CONSULTS OBTAINED:    DRUG ALLERGIES:  No Known Allergies  DISCHARGE MEDICATIONS:   Allergies as of 01/29/2018   No Known Allergies     Medication List    STOP taking these medications   dicyclomine 20 MG tablet Commonly known as:  BENTYL   sucralfate 1 g tablet Commonly known as:  CARAFATE     TAKE these medications   ciprofloxacin 500 MG tablet Commonly known as:  CIPRO Take 1 tablet (500 mg total) by mouth 2 (two) times daily for 7 days.   escitalopram 10 MG tablet Commonly known as:  LEXAPRO Take 10 mg by mouth daily.   metroNIDAZOLE 500 MG tablet Commonly known as:  FLAGYL Take 1 tablet (500 mg total) by mouth 3 (three) times daily for 7 days.   PROBIOTIC ACIDOPHILUS Caps Take 1 capsule by mouth 2 (two) times daily for 8 days.         Today   CHIEF COMPLAINT:   Patient has not had diarrhea since she has been hospitalized.   VITAL SIGNS:  Blood pressure 112/79, pulse 84, temperature 100.2 F (37.9 C), temperature source Oral, resp. rate 18, height 5\' 5"  (1.651 m), weight 68 kg (150 lb), last menstrual period 01/20/2018, SpO2 100 %.   REVIEW OF SYSTEMS:  Review of Systems  Constitutional: Negative.  Negative for chills, fever and malaise/fatigue.  HENT: Negative.  Negative for ear discharge, ear pain, hearing loss, nosebleeds and sore throat.   Eyes: Negative.  Negative for blurred vision and pain.  Respiratory: Negative.  Negative for cough, hemoptysis, shortness of breath and wheezing.   Cardiovascular: Negative.  Negative for chest pain, palpitations and leg swelling.  Gastrointestinal: Negative.  Negative for abdominal pain, blood in stool, diarrhea, nausea and vomiting.  Genitourinary: Negative.  Negative for dysuria.  Musculoskeletal: Negative.  Negative for back pain.  Skin: Negative.   Neurological: Negative for dizziness, tremors, speech change, focal weakness, seizures and headaches.  Endo/Heme/Allergies: Negative.  Does not bruise/bleed easily.  Psychiatric/Behavioral: Negative.  Negative for depression, hallucinations and suicidal ideas.     PHYSICAL EXAMINATION:  GENERAL:  32 y.o.-year-old patient lying in the bed with no acute distress.  NECK:  Supple, no jugular venous distention. No thyroid enlargement, no tenderness.  LUNGS: Normal breath sounds bilaterally, no wheezing, rales,rhonchi  No use of accessory muscles of respiration.  CARDIOVASCULAR: S1, S2 normal. No murmurs, rubs, or gallops.  ABDOMEN: Soft,  non-tender, non-distended. Bowel sounds present. No organomegaly or mass.  EXTREMITIES: No pedal edema, cyanosis, or clubbing.  PSYCHIATRIC: The patient is alert and oriented x 3.  SKIN: No obvious rash, lesion, or ulcer.   DATA REVIEW:   CBC Recent Labs  Lab 01/29/18 0431  WBC 5.8  HGB 11.8*  HCT 34.8*  PLT  139*    Chemistries  Recent Labs  Lab 01/28/18 1553 01/29/18 0431  NA 140 141  K 3.6 3.2*  CL 106 112*  CO2 19* 19*  GLUCOSE 84 79  BUN 11 10  CREATININE 0.83 0.77  CALCIUM 8.9 8.2*  AST 40  --   ALT 14  --   ALKPHOS 54  --   BILITOT 0.9  --     Cardiac Enzymes No results for input(s): TROPONINI in the last 168 hours.  Microbiology Results  @[email or web address] @  RADIOLOGY:  Ct Abdomen Pelvis W Contrast  Result Date: 01/28/2018 CLINICAL DATA:  Abdominal pain, nausea and vomiting, and diarrhea for 3 days. Altered mental status. EXAM: CT ABDOMEN AND PELVIS WITH CONTRAST TECHNIQUE: Multidetector CT imaging of the abdomen and pelvis was performed using the standard protocol following bolus administration of intravenous contrast. CONTRAST:  ISOVUE-300 IOPAMIDOL (ISOVUE-300) INJECTION 61% COMPARISON:  None. FINDINGS: Lower Chest: No acute findings. Hepatobiliary:  No hepatic masses identified. Pancreas:  No mass or inflammatory changes. Spleen: Within normal limits in size and appearance. Adrenals/Urinary Tract: No masses identified. No evidence of hydronephrosis. Stomach/Bowel: No evidence of bowel obstruction. Although the colon is nondistended, there is the suggestion of mild diffuse wall thickening and mucosal enhancement, suspicious for colitis. Small amount of free fluid is seen in the pelvic cul-de-sac. Normal appendix. No evidence of abscess. Vascular/Lymphatic: No pathologically enlarged lymph nodes. No abdominal aortic aneurysm. Reproductive:  No mass or other significant abnormality. Other:  None. Musculoskeletal:  No suspicious bone lesions identified. IMPRESSION: Possible mild diffuse colitis. Small amount of free pelvic fluid.  No evidence of abscess. Electronically Signed   By: Myles Rosenthal M.D.   On: 01/28/2018 21:01      Allergies as of 01/29/2018   No Known Allergies     Medication List    STOP taking these medications   dicyclomine 20 MG tablet Commonly known as:   BENTYL   sucralfate 1 g tablet Commonly known as:  CARAFATE     TAKE these medications   ciprofloxacin 500 MG tablet Commonly known as:  CIPRO Take 1 tablet (500 mg total) by mouth 2 (two) times daily for 7 days.   escitalopram 10 MG tablet Commonly known as:  LEXAPRO Take 10 mg by mouth daily.   metroNIDAZOLE 500 MG tablet Commonly known as:  FLAGYL Take 1 tablet (500 mg total) by mouth 3 (three) times daily for 7 days.   PROBIOTIC ACIDOPHILUS Caps Take 1 capsule by mouth 2 (two) times daily for 8 days.         Management plans discussed with the patient and she is in agreement. Stable for discharge   Patient should follow up with pcp  CODE STATUS:     Code Status Orders  (From admission, onward)        Start     Ordered   01/28/18 2345  Full code  Continuous     01/28/18 2344    Code Status History    This patient has a current code status but no historical code status.      TOTAL TIME TAKING CARE OF  THIS PATIENT: 38 minutes.    Note: This dictation was prepared with Dragon dictation along with smaller phrase technology. Any transcriptional errors that result from this process are unintentional.  Darionna Banke M.D on 01/29/2018 at 9:43 AM  Between 7am to 6pm - Pager - [phone] After 6pm go to [email or web address] - Social research officer, governmentpassword EPAS [facility]  [facility]  Office  [phone]  CC: Primary care physician; [facility]

## 2018-01-31 LAB — HIV ANTIBODY (ROUTINE TESTING W REFLEX): HIV Screen 4th Generation wRfx: NONREACTIVE

## 2018-11-23 IMAGING — US US PELVIS COMPLETE TRANSABD/TRANSVAG
1 series · 14 of 25 positions shown · non-contrast
Comparison: None

CLINICAL DATA: Right adnexal region pain

EXAM:
TRANSABDOMINAL AND TRANSVAGINAL ULTRASOUND OF PELVIS
TECHNIQUE: Study was performed transabdominally to optimize pelvic field of
view evaluation and transvaginally to optimize internal visceral
architecture evaluation.

[Series 1: us pelvis complete transabd/transvag · 14 of 105 slices shown]
[im 1/105]
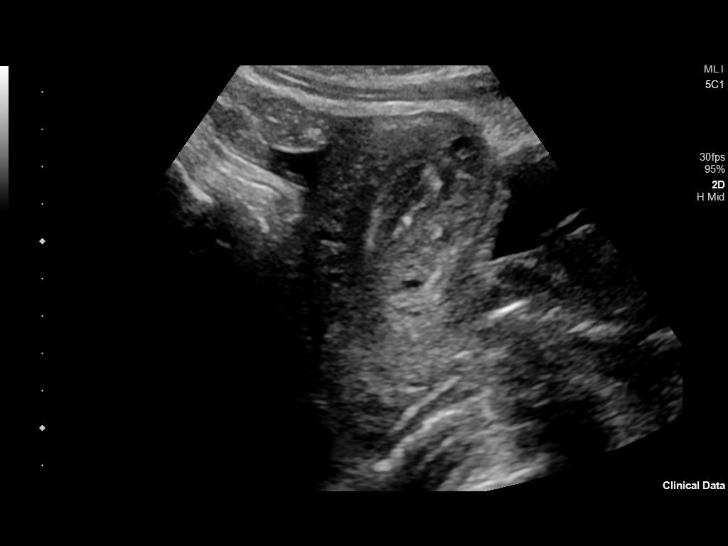
[im 9/105]
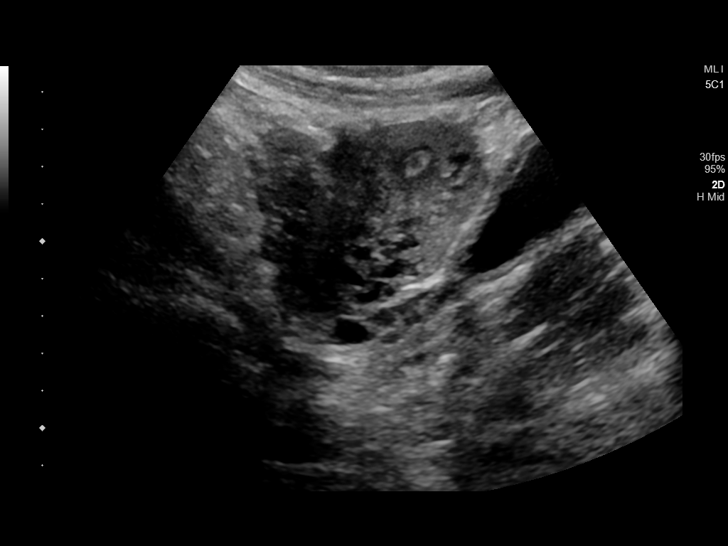
[im 18/105]
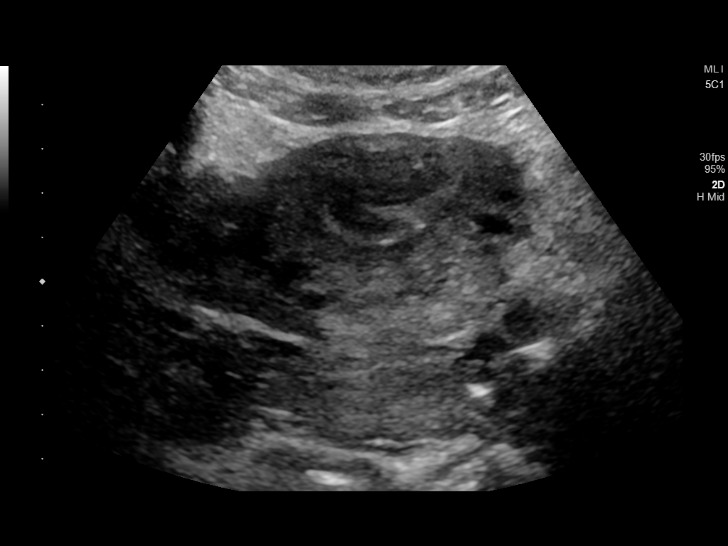
[im 27/105]
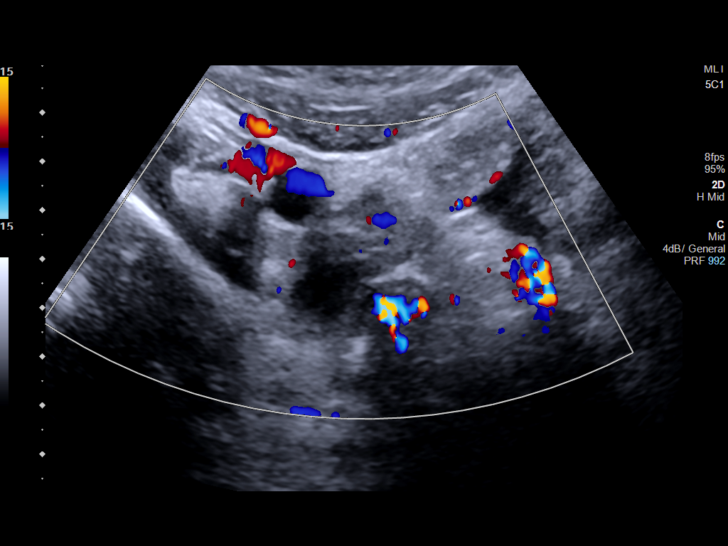
[im 35/105]
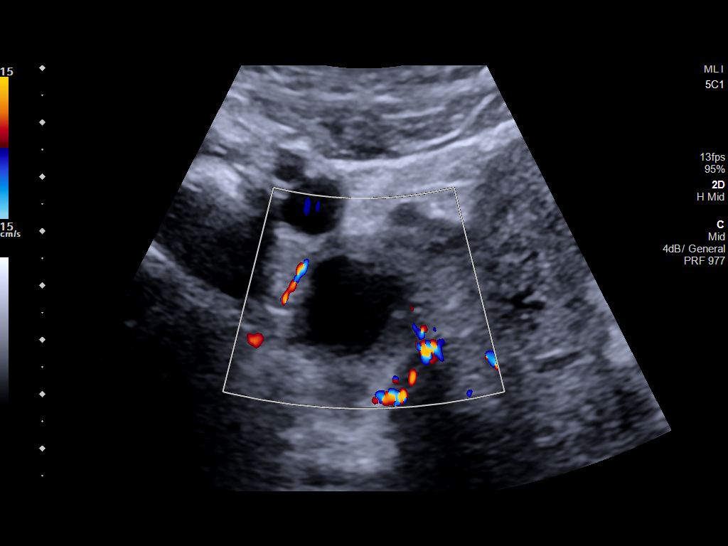
[im 40/105]
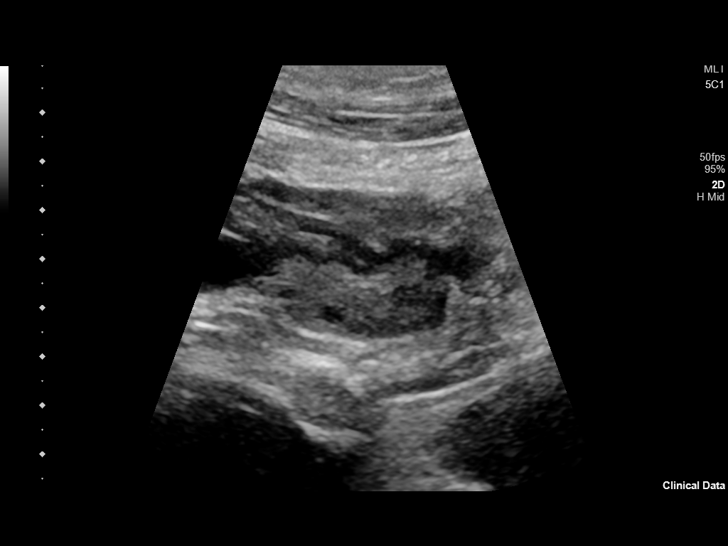
[im 48/105]
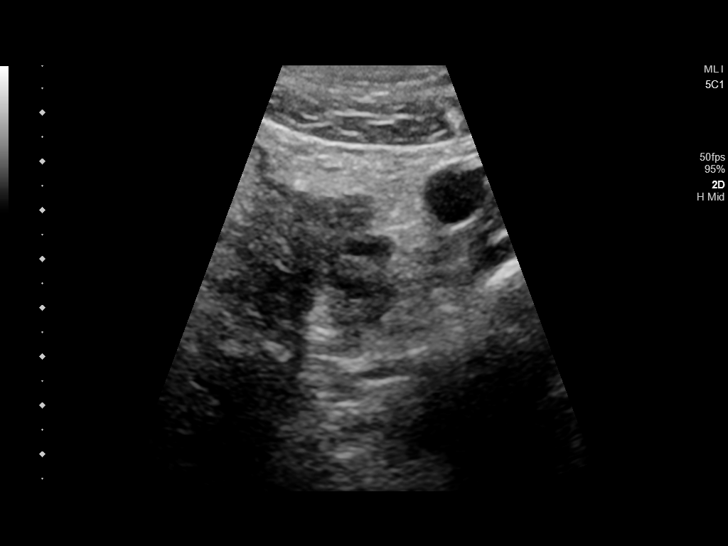
[im 57/105]
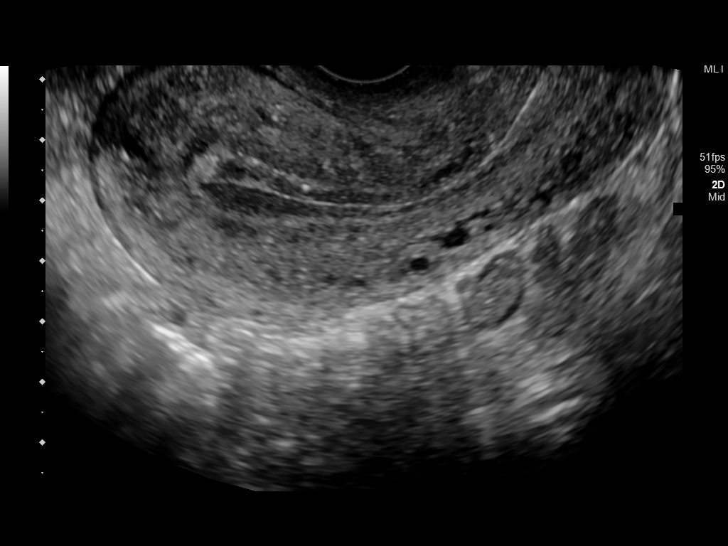
[im 66/105]
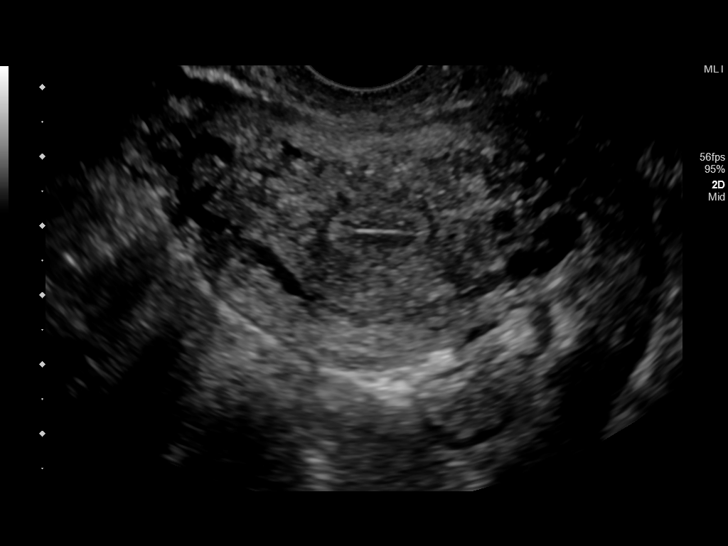
[im 70/105]
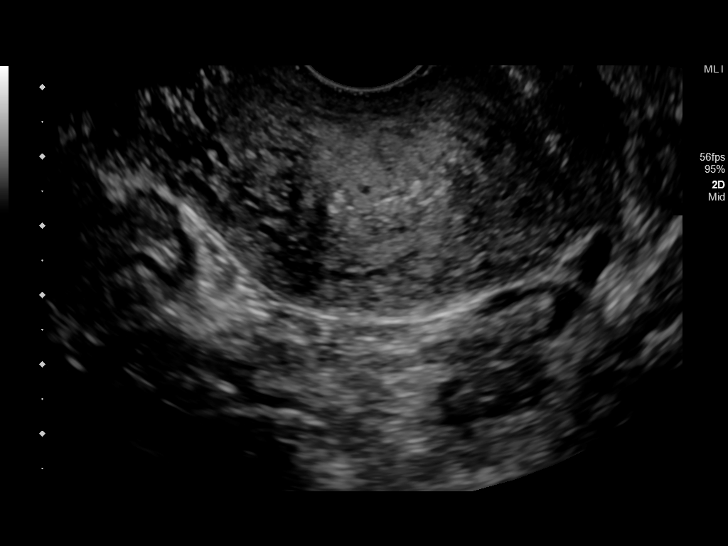
[im 79/105]
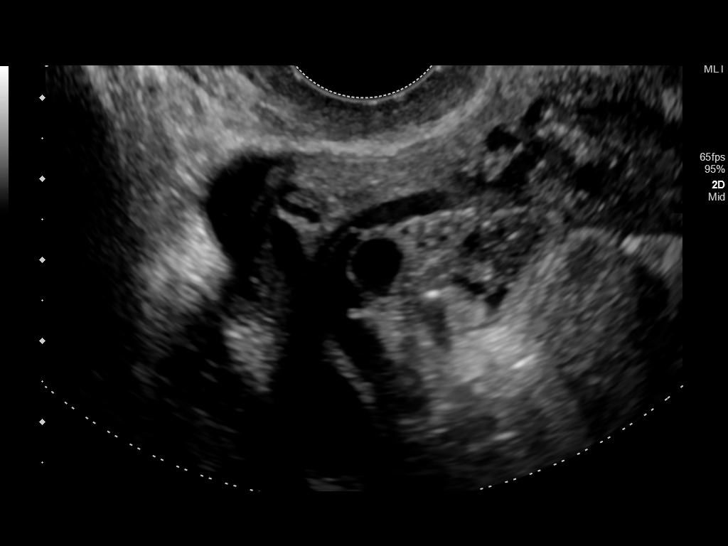
[im 87/105]
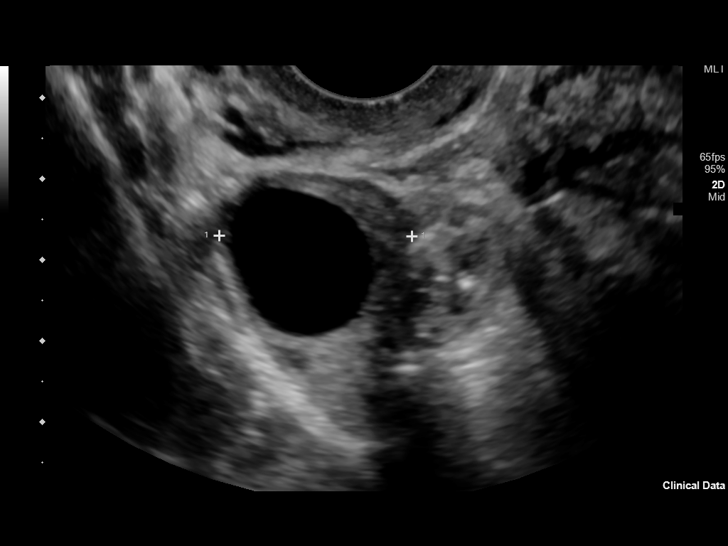
[im 96/105]
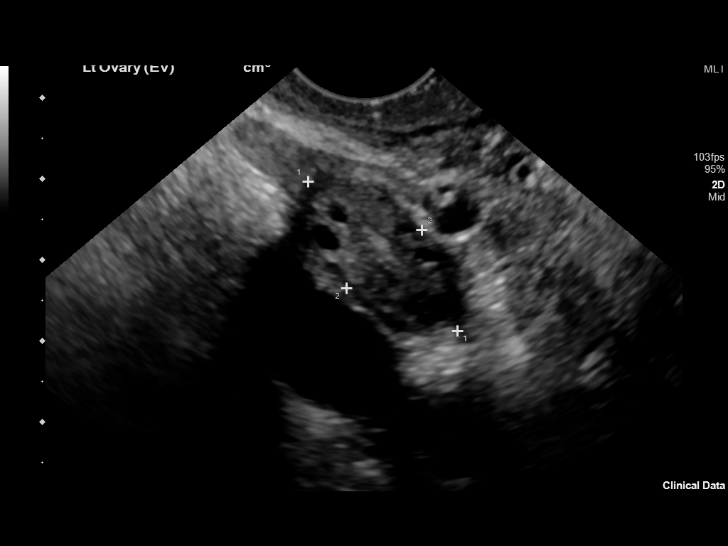
[im 105/105]
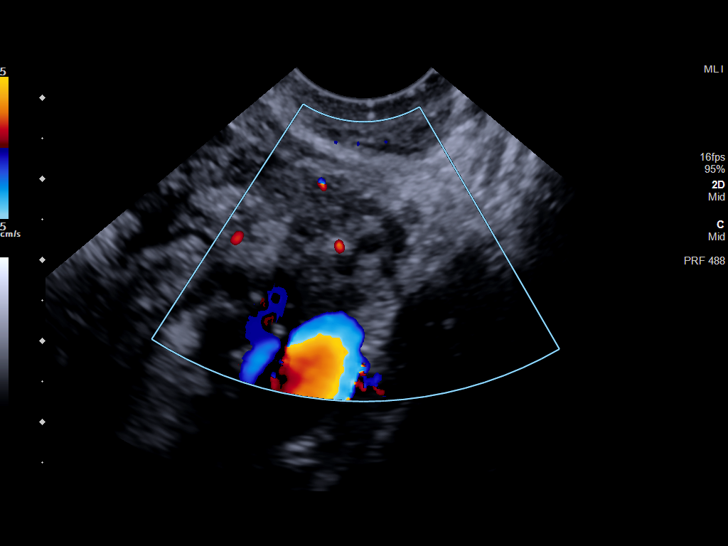

[14 of 25 positions shown; findings below may reference images not displayed]

FINDINGS: Uterus

Measurements: 9.6 x 4.5 x 5.7 cm. No fibroids or other mass
visualized.

Endometrium

Thickness: 11 mm.  No focal abnormality visualized.

Right ovary

Measurements: 3.4 x 2.3 x 3.4 cm.. Apparent dominant follicle right
ovary measures 2.1 x 1.7 x 1.7 cm. No other right-sided extrauterine
pelvic mass.

Left ovary

Measurements: 2.6 x 1.2 x 1.4 cm. Normal appearance/no adnexal mass.

Other findings

No abnormal free fluid.
IMPRESSION: Physiologic prominent follicle arising from right ovary. Study
otherwise unremarkable.

## 2019-08-05 ENCOUNTER — Emergency Department
Admission: EM | Admit: 2019-08-05 | Discharge: 2019-08-05 | Disposition: A | Discharge status: Home / Self Care | Payer: Medicaid Other | Attending: Emergency Medicine | Admitting: Emergency Medicine

## 2019-08-05 ENCOUNTER — Emergency Department: Payer: Medicaid Other

## 2019-08-05 DIAGNOSIS — Z79899 Other long term (current) drug therapy: Secondary | ICD-10-CM | POA: Diagnosis not present

## 2019-08-05 DIAGNOSIS — K529 Noninfective gastroenteritis and colitis, unspecified: Secondary | ICD-10-CM

## 2019-08-05 DIAGNOSIS — F17200 Nicotine dependence, unspecified, uncomplicated: Secondary | ICD-10-CM | POA: Diagnosis not present

## 2019-08-05 DIAGNOSIS — F1290 Cannabis use, unspecified, uncomplicated: Secondary | ICD-10-CM | POA: Insufficient documentation

## 2019-08-05 DIAGNOSIS — R109 Unspecified abdominal pain: Secondary | ICD-10-CM | POA: Diagnosis present

## 2019-08-05 DIAGNOSIS — R112 Nausea with vomiting, unspecified: Secondary | ICD-10-CM | POA: Diagnosis not present

## 2019-08-05 LAB — CBC
HCT: 42.4 % (ref 36.0–46.0)
Hemoglobin: 14.2 g/dL (ref 12.0–15.0)
MCH: 29.3 pg (ref 26.0–34.0)
MCHC: 33.5 g/dL (ref 30.0–36.0)
MCV: 87.6 fL (ref 80.0–100.0)
Platelets: 171 10*3/uL (ref 150–400)
RBC: 4.84 MIL/uL (ref 3.87–5.11)
RDW: 13 % (ref 11.5–15.5)
WBC: 7.1 10*3/uL (ref 4.0–10.5)
nRBC: 0 % (ref 0.0–0.2)

## 2019-08-05 LAB — COMPREHENSIVE METABOLIC PANEL
ALT: 14 U/L (ref 0–44)
AST: 28 U/L (ref 15–41)
Albumin: 4.3 g/dL (ref 3.5–5.0)
Alkaline Phosphatase: 67 U/L (ref 38–126)
Anion gap: 13 (ref 5–15)
BUN: 8 mg/dL (ref 6–20)
CO2: 21 mmol/L — ABNORMAL LOW (ref 22–32)
Calcium: 9.6 mg/dL (ref 8.9–10.3)
Chloride: 106 mmol/L (ref 98–111)
Creatinine, Ser: 0.85 mg/dL (ref 0.44–1.00)
GFR calc Af Amer: >60 mL/min (ref >60)
GFR calc non Af Amer: >60 mL/min (ref >60)
Glucose, Bld: 94 mg/dL (ref 70–99)
Potassium: 3.1 mmol/L — ABNORMAL LOW (ref 3.5–5.1)
Sodium: 140 mmol/L (ref 135–145)
Total Bilirubin: 0.5 mg/dL (ref 0.3–1.2)
Total Protein: 7.4 g/dL (ref 6.5–8.1)

## 2019-08-05 LAB — URINALYSIS, COMPLETE (UACMP) WITH MICROSCOPIC
Bilirubin Urine: NEGATIVE
Glucose, UA: NEGATIVE mg/dL
Hgb urine dipstick: NEGATIVE
Ketones, ur: NEGATIVE mg/dL
Leukocytes,Ua: NEGATIVE
Nitrite: NEGATIVE
Protein, ur: 30 mg/dL — AB
Specific Gravity, Urine: 1.016 (ref 1.005–1.030)
pH: 8 (ref 5.0–8.0)

## 2019-08-05 LAB — URINE DRUG SCREEN, QUALITATIVE (ARMC ONLY)
Amphetamines, Ur Screen: NOT DETECTED
Barbiturates, Ur Screen: NOT DETECTED
Benzodiazepine, Ur Scrn: NOT DETECTED
Cannabinoid 50 Ng, Ur ~~LOC~~: POSITIVE — AB
Cocaine Metabolite,Ur ~~LOC~~: NOT DETECTED
MDMA (Ecstasy)Ur Screen: NOT DETECTED
Methadone Scn, Ur: NOT DETECTED
Opiate, Ur Screen: NOT DETECTED
Phencyclidine (PCP) Ur S: NOT DETECTED
Tricyclic, Ur Screen: NOT DETECTED

## 2019-08-05 LAB — C DIFFICILE QUICK SCREEN W PCR REFLEX
C Diff antigen: NEGATIVE
C Diff interpretation: NOT DETECTED
C Diff toxin: NEGATIVE

## 2019-08-05 LAB — PREGNANCY, URINE: Preg Test, Ur: NEGATIVE

## 2019-08-05 LAB — LIPASE, BLOOD: Lipase: 31 U/L (ref 11–51)

## 2019-08-05 MED ORDER — IOHEXOL 300 MG/ML  SOLN
100.0000 mL | Freq: Once | INTRAMUSCULAR | Status: AC | PRN
  Administered 2019-08-05: 20:00:00 100 mL via INTRAVENOUS

## 2019-08-05 MED ORDER — SODIUM CHLORIDE 0.9% FLUSH
3.0000 mL | Freq: Once | INTRAVENOUS | Status: AC
  Administered 2019-08-05: 3 mL via INTRAVENOUS

## 2019-08-05 MED ORDER — ONDANSETRON 4 MG PO TBDP: 4.0000 mg | ORAL_TABLET | Freq: Three times a day (TID) | ORAL | 0 refills | Status: DC | PRN

## 2019-08-05 MED ORDER — PROCHLORPERAZINE EDISYLATE 10 MG/2ML IJ SOLN
10.0000 mg | Freq: Once | INTRAMUSCULAR | Status: AC
  Administered 2019-08-05: 10 mg via INTRAVENOUS
  Filled 2019-08-05: qty 2

## 2019-08-05 MED ORDER — DICYCLOMINE HCL 10 MG/ML IM SOLN
20.0000 mg | Freq: Once | INTRAMUSCULAR | Status: AC
  Administered 2019-08-05: 19:00:00 20 mg via INTRAMUSCULAR
  Filled 2019-08-05: qty 2

## 2019-08-05 MED ORDER — DIPHENHYDRAMINE HCL 50 MG/ML IJ SOLN
12.5000 mg | Freq: Once | INTRAMUSCULAR | Status: AC
  Administered 2019-08-05: 12.5 mg via INTRAVENOUS
  Filled 2019-08-05: qty 1

## 2019-08-05 MED ORDER — ONDANSETRON HCL 4 MG/2ML IJ SOLN: 4.0000 mg | Freq: Once | INTRAMUSCULAR | Status: DC | PRN

## 2019-08-05 MED ORDER — PROMETHAZINE HCL 25 MG/ML IJ SOLN
25.0000 mg | Freq: Once | INTRAMUSCULAR | Status: AC
  Administered 2019-08-05: 25 mg via INTRAVENOUS
  Filled 2019-08-05: qty 1

## 2019-08-05 MED ORDER — NALOXONE HCL 2 MG/2ML IJ SOSY
0.4000 mg | PREFILLED_SYRINGE | Freq: Once | INTRAMUSCULAR | Status: AC
  Administered 2019-08-05: 21:00:00 0.4 mg via INTRAVENOUS
  Filled 2019-08-05: qty 2

## 2019-08-05 MED ORDER — SODIUM CHLORIDE 0.9 % IV BOLUS
1000.0000 mL | Freq: Once | INTRAVENOUS | Status: AC
  Administered 2019-08-05: 1000 mL via INTRAVENOUS

## 2019-08-05 NOTE — ED Triage Notes (Signed)
Patient to ED via EMS from home c/o abdominal pain. Per EMS patient reports eating [employer] and within approximately 20 minutes began having generalized abdominal pain, nausea, and vomiting. Patient presents restless from pain reports pain 10/10.

## 2019-08-05 NOTE — ED Notes (Addendum)
Patient observed vomiting with diarrhea at the same time. Provider made aware, see orders. Patient also refuses COVID test at this time, provider made aware.

## 2019-08-05 NOTE — ED Provider Notes (Signed)
[facility] Emergency Department Provider Note ____________________________________________   First MD Initiated Contact with Patient 08/05/19 1619     (approximate)  I have reviewed the triage vital signs and the nursing notes.   HISTORY  Chief Complaint Abdominal Pain  HPI April Copeland is a 34 y.o. female who presents to the emergency department for treatment and evaluation of abdominal pain with nausea, vomiting, and diarrhea.  She arrived to the emergency department via EMS.  She had told them that approximately 20 minutes prior to symptom onset she had eaten [employer].  No one else became ill.  No alleviating measures attempted prior to arrival.         History reviewed. No pertinent past medical history.  Patient Active Problem List   Diagnosis Date Noted  . Acute colitis 01/28/2018    History reviewed. No pertinent surgical history.  Prior to Admission medications   Medication Sig Start Date End Date Taking? Authorizing Provider  escitalopram (LEXAPRO) 10 MG tablet Take 10 mg by mouth daily.    [provider]  ondansetron (ZOFRAN-ODT) 4 MG disintegrating tablet Take 1 tablet (4 mg total) by mouth every 8 (eight) hours as needed for nausea or vomiting. 08/05/19   Kem Boroughs B, FNP    Allergies Patient has no known allergies.  History reviewed. No pertinent family history.  Social History Social History   Tobacco Use  . Smoking status: Current Every Day Smoker  . Smokeless tobacco: Never Used  Substance Use Topics  . Alcohol use: No  . Drug use: Yes    Types: Marijuana    Review of Systems  Constitutional: No fever/chills Eyes: No visual changes. ENT: No sore throat. Cardiovascular: Denies chest pain. Respiratory: Denies shortness of breath. Gastrointestinal: Positive for abdominal pain, nausea, vomiting, diarrhea.  Genitourinary: Negative for dysuria. Musculoskeletal: Negative for back pain. Skin: Negative for  rash. Neurological: Negative for headaches, focal weakness or numbness. ___________________________________________   PHYSICAL EXAM:  VITAL SIGNS: ED Triage Vitals  Enc Vitals Group     BP 08/05/19 1621 129/79     Pulse Rate 08/05/19 1621 62     Resp 08/05/19 1621 14     Temp 08/05/19 1621 98.6 F (37 C)     Temp Source 08/05/19 1621 Axillary     SpO2 08/05/19 1618 100 %     Weight 08/05/19 1621 160 lb (72.6 kg)     Height 08/05/19 1621 5\' 5"  (1.651 m)     Head Circumference --      Peak Flow --      Pain Score 08/05/19 1621 10     Pain Loc --      Pain Edu? --      Excl. in GC? --     Constitutional: Alert.  Restless. Eyes: Conjunctivae are normal. PERRL. Head: Atraumatic. Nose: No congestion/rhinnorhea. Mouth/Throat: Mucous membranes are moist.  Oropharynx non-erythematous. Neck: No stridor.   Hematological/Lymphatic/Immunilogical: No cervical lymphadenopathy. Cardiovascular: Normal rate, regular rhythm. Grossly normal heart sounds.  Good peripheral circulation. Respiratory: Normal respiratory effort.  No retractions. Lungs CTAB. Gastrointestinal: Soft and diffusely tender. No distention. No abdominal bruits. No CVA tenderness. Genitourinary:  Musculoskeletal: No lower extremity tenderness nor edema.  No joint effusions. Neurologic:  Normal speech and language. No gross focal neurologic deficits are appreciated. No gait instability. Skin:  Skin is warm, dry and intact. No rash noted. Psychiatric: Restless, poor eye contact, mumbles in answer to questions.  ____________________________________________   LABS (all labs  ordered are listed, but only abnormal results are displayed)  Labs Reviewed  COMPREHENSIVE METABOLIC PANEL - Abnormal; Notable for the following components:      Result Value   Potassium 3.1 (*)    CO2 21 (*)    All other components within normal limits  URINALYSIS, COMPLETE (UACMP) WITH MICROSCOPIC - Abnormal; Notable for the following components:    Color, Urine YELLOW (*)    APPearance HAZY (*)    Protein, ur 30 (*)    Bacteria, UA RARE (*)    All other components within normal limits  URINE DRUG SCREEN, QUALITATIVE ([facility] ONLY) - Abnormal; Notable for the following components:   Cannabinoid 50 Ng, Ur [place] POSITIVE (*)    All other components within normal limits  C DIFFICILE QUICK SCREEN W PCR REFLEX  GI PATHOGEN PANEL BY PCR, STOOL  LIPASE, BLOOD  CBC  PREGNANCY, URINE  POC SARS CORONAVIRUS 2 AG -  ED   ____________________________________________  EKG  Not indicated ____________________________________________  RADIOLOGY  ED MD interpretation:    colitis  Official radiology report(s): CT ABDOMEN PELVIS W CONTRAST  Result Date: 08/05/2019 CLINICAL DATA:  Abdominal pain, nausea, vomiting, diarrhea. EXAM: CT ABDOMEN AND PELVIS WITH CONTRAST TECHNIQUE: Multidetector CT imaging of the abdomen and pelvis was performed using the standard protocol following bolus administration of intravenous contrast. CONTRAST:  170mL OMNIPAQUE IOHEXOL 300 MG/ML  SOLN COMPARISON:  CT abdomen dated 01/28/2018. FINDINGS: Lower chest: No acute abnormality. Hepatobiliary: No focal liver abnormality is seen. No gallstones, gallbladder wall thickening, or biliary dilatation. Pancreas: Unremarkable. No pancreatic ductal dilatation or surrounding inflammatory changes. Spleen: Normal in size without focal abnormality. Adrenals/Urinary Tract: Adrenal glands appear normal. Kidneys appear normal without mass, stone or hydronephrosis. No perinephric fluid. Bladder is unremarkable, partially decompressed. Stomach/Bowel: No dilated large or small bowel loops. Portions of the large and small bowel cannot be definitively characterized due to extensive patient motion artifact, however, some portions of the colon appear to have mildly thickened walls raising the possibility of a colitis of infectious or inflammatory nature. Stomach is unremarkable, partially  decompressed. Appendix is normal. Vascular/Lymphatic: No significant vascular findings are present. No enlarged abdominal or pelvic lymph nodes. Reproductive: No adnexal mass. Small amount of free fluid in the cul-de-sac is likely physiologic in nature. Other: No significant free fluid. No abscess collection. No free intraperitoneal air. Musculoskeletal: No acute or suspicious osseous finding. IMPRESSION: 1. Probable mild colitis of infectious or inflammatory nature. 2. Remainder of the abdomen and pelvis CT is unremarkable. No bowel obstruction. No evidence of acute solid organ abnormality. No renal or ureteral calculi. Appendix is normal. Electronically Signed   By: Franki Cabot M.D.   On: 08/05/2019 20:26    ____________________________________________   PROCEDURES  Procedure(s) performed (including Critical Care):  Procedures  ____________________________________________   INITIAL IMPRESSION / ASSESSMENT AND PLAN   34 year old female presenting to the emergency department for treatment and evaluation of abdominal pain with nausea, vomiting, and diarrhea. She answers questions appropriately, but does not make eye contact and mumbles in a low tone. She is also making comments out of context of the situation. She is actively vomiting. No relief with the zofran given by EMS. Labs, fluids, and phenergan ordered. Will also try and get a stool specimen.   DIFFERENTIAL DIAGNOSIS  Food poisoning, COVID-19, gastroenteritis, hyperemesis secondary to cannabis  ED COURSE  Patient continues to vomit even after Phenergan.  RN reports that she continues to have diarrhea as well.  Stool  specimen has been submitted to the lab.  CT of the abdomen and pelvis has also been requested.  ----------------------------------------- 8:30 PM on 08/05/2019 -----------------------------------------  Overall, labs are reassuring.  She does have a very mild hypokalemia at 3.1.  There is no indication of systemic  infection.  Her white blood cell count 7.1.  She is not tachycardic or febrile.  Working diagnosis at this point is hyperemesis secondary to cannabis or possibly overuse of her buprenorphine.  Once she gets back from CT, a trial of Narcan is to be administered to see if she becomes more awake and able to maintain eye contact to have a conversation.   ----------------------------------------- 8:50 PM on 08/05/2019 -----------------------------------------  Narcan administered. She is more awake. Patient evaluated by Dr. Mayford Knife who also spoke with her. Plan will be to discharge her home with nausea medication. CT shows colitis and with the reported symptoms is most likely inflammation instead of infection. She states that she has someone one the way to get her. She would like to go home. She was advised that smoking marijuana is most likely the source of her symptoms. She is to return to the ER for symptoms of concern if unable to see GI or PCP.  ____________________________________________   FINAL CLINICAL IMPRESSION(S) / ED DIAGNOSES  Final diagnoses:  Colitis, acute  Cannabinoid hyperemesis syndrome     ED Discharge Orders         Ordered    ondansetron (ZOFRAN-ODT) 4 MG disintegrating tablet  Every 8 hours PRN     08/05/19 2108           Note:  This document was prepared using Dragon voice recognition software and may include unintentional dictation errors.   Chinita Pester, FNP 08/05/19 2109    Emily Filbert, MD 08/05/19 2135

## 2019-08-09 LAB — GI PATHOGEN PANEL BY PCR, STOOL

## 2020-01-04 ENCOUNTER — Emergency Department
Admission: EM | Admit: 2020-01-04 | Discharge: 2020-01-05 | Disposition: A | Discharge status: Home / Self Care | Payer: Medicaid Other | Attending: Student in an Organized Health Care Education/Training Program | Admitting: Student in an Organized Health Care Education/Training Program

## 2020-01-04 ENCOUNTER — Other Ambulatory Visit: Payer: Self-pay

## 2020-01-04 DIAGNOSIS — Z79899 Other long term (current) drug therapy: Secondary | ICD-10-CM | POA: Diagnosis not present

## 2020-01-04 DIAGNOSIS — R5381 Other malaise: Secondary | ICD-10-CM | POA: Diagnosis not present

## 2020-01-04 DIAGNOSIS — E876 Hypokalemia: Secondary | ICD-10-CM | POA: Insufficient documentation

## 2020-01-04 DIAGNOSIS — F1290 Cannabis use, unspecified, uncomplicated: Secondary | ICD-10-CM | POA: Diagnosis not present

## 2020-01-04 DIAGNOSIS — F17200 Nicotine dependence, unspecified, uncomplicated: Secondary | ICD-10-CM | POA: Diagnosis not present

## 2020-01-04 DIAGNOSIS — R112 Nausea with vomiting, unspecified: Secondary | ICD-10-CM | POA: Insufficient documentation

## 2020-01-04 LAB — COMPREHENSIVE METABOLIC PANEL
ALT: 14 U/L (ref 0–44)
AST: 29 U/L (ref 15–41)
Albumin: 4.8 g/dL (ref 3.5–5.0)
Alkaline Phosphatase: 64 U/L (ref 38–126)
Anion gap: 11 (ref 5–15)
BUN: 10 mg/dL (ref 6–20)
CO2: 23 mmol/L (ref 22–32)
Calcium: 9.6 mg/dL (ref 8.9–10.3)
Chloride: 106 mmol/L (ref 98–111)
Creatinine, Ser: 0.78 mg/dL (ref 0.44–1.00)
GFR calc Af Amer: >60 mL/min (ref >60)
GFR calc non Af Amer: >60 mL/min (ref >60)
Glucose, Bld: 90 mg/dL (ref 70–99)
Potassium: 3 mmol/L — ABNORMAL LOW (ref 3.5–5.1)
Sodium: 140 mmol/L (ref 135–145)
Total Bilirubin: 1 mg/dL (ref 0.3–1.2)
Total Protein: 7.8 g/dL (ref 6.5–8.1)

## 2020-01-04 LAB — CBC
HCT: 42 % (ref 36.0–46.0)
Hemoglobin: 14.4 g/dL (ref 12.0–15.0)
MCH: 29.6 pg (ref 26.0–34.0)
MCHC: 34.3 g/dL (ref 30.0–36.0)
MCV: 86.4 fL (ref 80.0–100.0)
Platelets: 157 10*3/uL (ref 150–400)
RBC: 4.86 MIL/uL (ref 3.87–5.11)
RDW: 12.5 % (ref 11.5–15.5)
WBC: 5.1 10*3/uL (ref 4.0–10.5)
nRBC: 0 % (ref 0.0–0.2)

## 2020-01-04 LAB — LIPASE, BLOOD: Lipase: 25 U/L (ref 11–51)

## 2020-01-04 LAB — HCG, QUANTITATIVE, PREGNANCY: hCG, Beta Chain, Quant, S: <1 m[IU]/mL (ref <5)

## 2020-01-04 MED ORDER — PANTOPRAZOLE SODIUM 40 MG IV SOLR
40.0000 mg | Freq: Once | INTRAVENOUS | Status: AC
  Administered 2020-01-04: 40 mg via INTRAVENOUS
  Filled 2020-01-04: qty 40

## 2020-01-04 MED ORDER — PROMETHAZINE HCL 25 MG/ML IJ SOLN
12.5000 mg | Freq: Four times a day (QID) | INTRAMUSCULAR | Status: DC | PRN
  Administered 2020-01-04: 12.5 mg via INTRAVENOUS
  Filled 2020-01-04: qty 1

## 2020-01-04 MED ORDER — SODIUM CHLORIDE 0.9 % IV BOLUS
1000.0000 mL | Freq: Once | INTRAVENOUS | Status: AC
  Administered 2020-01-04: 1000 mL via INTRAVENOUS

## 2020-01-04 MED ORDER — ONDANSETRON 4 MG PO TBDP
ORAL_TABLET | ORAL | Status: AC
  Administered 2020-01-04: 4 mg via ORAL
  Filled 2020-01-04: qty 1

## 2020-01-04 MED ORDER — PANTOPRAZOLE SODIUM 20 MG PO TBEC
20.0000 mg | DELAYED_RELEASE_TABLET | Freq: Every day | ORAL | 0 refills | Status: DC
Start: 2020-01-04 — End: 2023-06-03

## 2020-01-04 MED ORDER — LACTATED RINGERS IV BOLUS: 1000.0000 mL | Freq: Once | INTRAVENOUS | Status: DC

## 2020-01-04 MED ORDER — ONDANSETRON HCL 4 MG/2ML IJ SOLN: 4.0000 mg | Freq: Once | INTRAMUSCULAR | Status: DC

## 2020-01-04 MED ORDER — POTASSIUM CHLORIDE CRYS ER 20 MEQ PO TBCR: 40.0000 meq | EXTENDED_RELEASE_TABLET | Freq: Once | ORAL | Status: DC

## 2020-01-04 MED ORDER — POTASSIUM CHLORIDE 20 MEQ PO PACK
20.0000 meq | PACK | Freq: Once | ORAL | Status: AC
  Administered 2020-01-04: 20 meq via ORAL
  Filled 2020-01-04: qty 1

## 2020-01-04 MED ORDER — PROMETHAZINE HCL 12.5 MG PO TABS: 12.5000 mg | ORAL_TABLET | Freq: Four times a day (QID) | ORAL | 0 refills | Status: DC | PRN

## 2020-01-04 MED ORDER — LIDOCAINE VISCOUS HCL 2 % MT SOLN
15.0000 mL | Freq: Once | OROMUCOSAL | Status: AC
  Administered 2020-01-04: 15 mL via ORAL
  Filled 2020-01-04: qty 15

## 2020-01-04 MED ORDER — POTASSIUM CHLORIDE CRYS ER 20 MEQ PO TBCR
40.0000 meq | EXTENDED_RELEASE_TABLET | Freq: Once | ORAL | Status: AC
  Administered 2020-01-04: 40 meq via ORAL
  Filled 2020-01-04: qty 2

## 2020-01-04 MED ORDER — ONDANSETRON 4 MG PO TBDP: 4.0000 mg | ORAL_TABLET | Freq: Once | ORAL | Status: AC

## 2020-01-04 MED ORDER — ONDANSETRON HCL 4 MG/2ML IJ SOLN
INTRAMUSCULAR | Status: AC
  Filled 2020-01-04: qty 2

## 2020-01-04 MED ORDER — ALUM & MAG HYDROXIDE-SIMETH 200-200-20 MG/5ML PO SUSP
30.0000 mL | Freq: Once | ORAL | Status: AC
  Administered 2020-01-04: 30 mL via ORAL
  Filled 2020-01-04: qty 30

## 2020-01-04 NOTE — ED Notes (Signed)
Pt requesting IV fluids and a GI cocktail. Notified EDP Roxan Hockey.

## 2020-01-04 NOTE — ED Notes (Signed)
Pt given cup of water with verbal okay from EDP Robinson. Called lab and state they can add on hCG preg to previously collected blood tubes.

## 2020-01-04 NOTE — ED Notes (Signed)
This RN called pt's family per request to notify pt's mother Joni Reining that she can come back to the bedside.

## 2020-01-04 NOTE — ED Notes (Signed)
Pt in from home post covid vaccine (1st yesterday) with N/V/D, body aches, fever of 100 per pt, and general abdominal cramping/sharp pains. Abdomen soft and pt is not tender. Last BM earlier tonight in ER restroom per pt. Pt understands urine sample is needed and is requesting a cup of water. EDP notified.

## 2020-01-04 NOTE — ED Provider Notes (Signed)
[facility] Emergency Department Provider Note    First MD Initiated Contact with Patient 01/04/20 2128     (approximate)  I have reviewed the triage vital signs and the nursing notes.   HISTORY  Chief Complaint covid vaccine side effects    HPI April Copeland is a 34 y.o. female [place] past medical history presents to the ER for upset stomach nausea vomiting malaise for the past 24 hours after receiving her second [place] vaccine for Covid.  Does have a history of gastritis has not been taking her antiacid medication.  Has been try to keep fluids down.  Denies any abdominal pain.  No shortness of breath.  No fevers.  Is requesting GI cocktail.  Denies any dysuria.  No pain radiating through to her back.    History reviewed. No pertinent past medical history. No family history on file. History reviewed. No pertinent surgical history. Patient Active Problem List   Diagnosis Date Noted  . Acute colitis 01/28/2018      Prior to Admission medications   Medication Sig Start Date End Date Taking? Authorizing Provider  escitalopram (LEXAPRO) 10 MG tablet Take 10 mg by mouth daily.    [provider]  ondansetron (ZOFRAN-ODT) 4 MG disintegrating tablet Take 1 tablet (4 mg total) by mouth every 8 (eight) hours as needed for nausea or vomiting. 08/05/19   Triplett, Cari B, FNP  pantoprazole (PROTONIX) 20 MG tablet Take 1 tablet (20 mg total) by mouth daily. 01/04/20 01/03/21  Merlyn Lot, MD  promethazine (PHENERGAN) 12.5 MG tablet Take 1 tablet (12.5 mg total) by mouth every 6 (six) hours as needed. 01/04/20   Merlyn Lot, MD    Allergies Patient has no known allergies.    Social History Social History   Tobacco Use  . Smoking status: Current Every Day Smoker  . Smokeless tobacco: Never Used  Substance Use Topics  . Alcohol use: No  . Drug use: Yes    Types: Marijuana    Review of Systems Patient denies headaches, rhinorrhea,  blurry vision, numbness, shortness of breath, chest pain, edema, cough, abdominal pain, nausea, vomiting, diarrhea, dysuria, fevers, rashes or hallucinations unless otherwise stated above in HPI. ____________________________________________   PHYSICAL EXAM:  VITAL SIGNS: Vitals:   01/04/20 2200 01/04/20 2210  BP: 126/79   Pulse: (!) 59 63  Resp: 17 17  Temp:    SpO2: 100% 100%    Constitutional: Alert and oriented.  Eyes: Conjunctivae are normal.  Head: Atraumatic. Nose: No congestion/rhinnorhea. Mouth/Throat: Mucous membranes are moist.   Neck: No stridor. Painless ROM.  Cardiovascular: Normal rate, regular rhythm. Grossly normal heart sounds.  Good peripheral circulation. Respiratory: Normal respiratory effort.  No retractions. Lungs CTAB. Gastrointestinal: Soft and nontender. No distention. No abdominal bruits. No CVA tenderness. Genitourinary:  Musculoskeletal: No lower extremity tenderness nor edema.  No joint effusions. Neurologic:  Normal speech and language. No gross focal neurologic deficits are appreciated. No facial droop Skin:  Skin is warm, dry and intact. No rash noted. Psychiatric: Mood and affect are normal. Speech and behavior are normal.  ____________________________________________   LABS (all labs ordered are listed, but only abnormal results are displayed)  Results for orders placed or performed during the hospital encounter of 01/04/20 (from the past 24 hour(s))  Lipase, blood     Status: None   Collection Time: 01/04/20  8:39 PM  Result Value Ref Range   Lipase 25 11 - 51 U/L  Comprehensive metabolic panel  Status: Abnormal   Collection Time: 01/04/20  8:39 PM  Result Value Ref Range   Sodium 140 135 - 145 mmol/L   Potassium 3.0 (L) 3.5 - 5.1 mmol/L   Chloride 106 98 - 111 mmol/L   CO2 23 22 - 32 mmol/L   Glucose, Bld 90 70 - 99 mg/dL   BUN 10 6 - 20 mg/dL   Creatinine, Ser 9.23 0.44 - 1.00 mg/dL   Calcium 9.6 8.9 - 30.0 mg/dL   Total  Protein 7.8 6.5 - 8.1 g/dL   Albumin 4.8 3.5 - 5.0 g/dL   AST 29 15 - 41 U/L   ALT 14 0 - 44 U/L   Alkaline Phosphatase 64 38 - 126 U/L   Total Bilirubin 1.0 0.3 - 1.2 mg/dL   GFR calc non Af Amer >60 >60 mL/min   GFR calc Af Amer >60 >60 mL/min   Anion gap 11 5 - 15  CBC     Status: None   Collection Time: 01/04/20  8:39 PM  Result Value Ref Range   WBC 5.1 4.0 - 10.5 K/uL   RBC 4.86 3.87 - 5.11 MIL/uL   Hemoglobin 14.4 12.0 - 15.0 g/dL   HCT 76.2 36 - 46 %   MCV 86.4 80.0 - 100.0 fL   MCH 29.6 26.0 - 34.0 pg   MCHC 34.3 30.0 - 36.0 g/dL   RDW 26.3 33.5 - 45.6 %   Platelets 157 150 - 400 K/uL   nRBC 0.0 0.0 - 0.2 %  hCG, quantitative, pregnancy     Status: None   Collection Time: 01/04/20  8:39 PM  Result Value Ref Range   hCG, Beta Chain, Quant, S <1 <5 mIU/mL   ____________________________________________  EKG My review and personal interpretation at Time: 20:36   Indication: nausea  Rate: 50  Rhythm: sinus Axis: nroaml Other: irbbb, no stemi, no depressions ____________________________________________  RADIOLOGY  I personally reviewed all radiographic images ordered to evaluate for the above acute complaints and reviewed radiology reports and findings.  These findings were personally discussed with the patient.  Please see medical record for radiology report.  ____________________________________________   PROCEDURES  Procedure(s) performed:  Procedures    Critical Care performed: no ____________________________________________   INITIAL IMPRESSION / ASSESSMENT AND PLAN / ED COURSE  Pertinent labs & imaging results that were available during my care of the patient were reviewed by me and considered in my medical decision making (see chart for details).   DDX: Dehydration, gastritis, enteritis, pancreatitis, biliary pathology, hepatitis, vaccine reaction, PUD  April Copeland is a 34 y.o. who presents to the ED with symptoms as described above.  Patient is  clinically very well-appearing although has reportedly been having nausea and vomiting.  She is holding an emesis bag but speaking in complete sentences with benign abdominal exam.  Blood work does show evidence of mild hypokalemia that would be consistent with patient's emesis.  Do not feel that diagnostic imaging clinically indicated with this presentation.  Will treat symptomatically.  Will give IV fluids IV antibiotics and acid and reassess.  Clinical Course as of Jan 03 2341  Fri Jan 04, 2020  2338 Patient reassessed.  Feels much improved.  Says she cannot swallow pills therefore will change her K. Dur tablet to the packet.  She is receiving IV fluids but she feels much better with a benign exam.  I will place her on a an acid medication as well as give prescription for antiemetic.  She does appear clinically stable and appropriate for discharge home.   [PR]    Clinical Course User Index [PR] Willy Eddy, MD    The patient was evaluated in Emergency Department today for the symptoms described in the history of present illness. He/she was evaluated in the context of the global COVID-19 pandemic, which necessitated consideration that the patient might be at risk for infection with the SARS-CoV-2 virus that causes COVID-19. Institutional protocols and algorithms that pertain to the evaluation of patients at risk for COVID-19 are in a state of rapid change based on information released by regulatory bodies including the CDC and federal and state organizations. These policies and algorithms were followed during the patient's care in the ED.  As part of my medical decision making, I reviewed the following data within the electronic MEDICAL RECORD NUMBER Nursing notes reviewed and incorporated, Labs reviewed, notes from prior ED visits and [place] Controlled Substance Database   ____________________________________________   FINAL CLINICAL IMPRESSION(S) / ED DIAGNOSES  Final diagnoses:    Non-intractable vomiting with nausea, unspecified vomiting type  Hypokalemia      NEW MEDICATIONS STARTED DURING THIS VISIT:  New Prescriptions   PANTOPRAZOLE (PROTONIX) 20 MG TABLET    Take 1 tablet (20 mg total) by mouth daily.   PROMETHAZINE (PHENERGAN) 12.5 MG TABLET    Take 1 tablet (12.5 mg total) by mouth every 6 (six) hours as needed.     Note:  This document was prepared using Dragon voice recognition software and may include unintentional dictation errors.    Willy Eddy, MD 01/04/20 2342

## 2020-01-04 NOTE — ED Notes (Signed)
Pt visualized resting, family bedside. NAD noted, VSS.

## 2020-01-04 NOTE — ED Triage Notes (Signed)
Pt to the er via ems for side effects to the covid vaccine. Pt got the Pfizer vaccine yesterday and now has N/V/D, cramping and joint pain. Pt reports her stomach is burning. Pt has a hx of a stomach ulcer. Pt states sh doesn't take meds every day for her ulcer. VSS with EMS 145/80, 97.7, HR 65. Pt states she has not taken any tylenol, IBU, alieve or other OTC meds to relieve symptoms. Pt only med is suboxone. No emesis at this time.

## 2020-01-04 NOTE — ED Notes (Signed)
IV team to bedside. 

## 2022-08-23 ENCOUNTER — Ambulatory Visit
Admission: EM | Admit: 2022-08-23 | Discharge: 2022-08-23 | Disposition: A | Discharge status: Home / Self Care | Payer: Medicaid Other | Attending: Physician Assistant | Admitting: Physician Assistant

## 2022-08-23 DIAGNOSIS — J019 Acute sinusitis, unspecified: Secondary | ICD-10-CM | POA: Diagnosis not present

## 2022-08-23 DIAGNOSIS — R519 Headache, unspecified: Secondary | ICD-10-CM

## 2022-08-23 DIAGNOSIS — R0981 Nasal congestion: Secondary | ICD-10-CM | POA: Diagnosis not present

## 2022-08-23 MED ORDER — PREDNISONE 20 MG PO TABS: 40.0000 mg | ORAL_TABLET | Freq: Every day | ORAL | 0 refills | Status: AC

## 2022-08-23 MED ORDER — IPRATROPIUM BROMIDE 0.06 % NA SOLN: 2.0000 | Freq: Four times a day (QID) | NASAL | 0 refills | Status: DC

## 2022-08-23 MED ORDER — AMOXICILLIN-POT CLAVULANATE 875-125 MG PO TABS: 1.0000 | ORAL_TABLET | Freq: Two times a day (BID) | ORAL | 0 refills | Status: AC

## 2022-08-23 NOTE — ED Triage Notes (Signed)
Pt c/o headache x2 weeks, pt deneis any previous hx of migraines. Headache behind eyes

## 2022-08-23 NOTE — ED Provider Notes (Signed)
[facility]    CSN: [id number] Arrival date & time: 08/23/22  [place]      History   Chief Complaint Chief Complaint  Patient presents with   Headache    HPI April Copeland is a 37 y.o. female presenting for 2-week history of nasal congestion, yellowish-green nasal drainage, headache, sinus pain with pressure behind the eyes, postnasal drainage, fatigue.  Patient denies fever, sore throat, cough, breathing difficulty, vomiting or diarrhea.  Reports trying multiple over-the-counter decongestants, antihistamines and nasal sprays all without relief.  Symptoms have not worsened or improved from onset.  History of sinus problems and allergies.  HPI  History reviewed. No pertinent past medical history.  Patient Active Problem List   Diagnosis Date Noted   Acute colitis 01/28/2018    History reviewed. No pertinent surgical history.  OB History   No obstetric history on file.      Home Medications    Prior to Admission medications   Medication Sig Start Date End Date Taking? Authorizing Provider  amoxicillin-clavulanate (AUGMENTIN) 875-125 MG tablet Take 1 tablet by mouth every 12 (twelve) hours for 7 days. 08/23/22 08/30/22 Yes Laurene Footman B, PA-C  ipratropium (ATROVENT) 0.06 % nasal spray Place 2 sprays into both nostrils 4 (four) times daily. 08/23/22  Yes Danton Clap, PA-C  predniSONE (DELTASONE) 20 MG tablet Take 2 tablets (40 mg total) by mouth daily for 5 days. 08/23/22 08/28/22 Yes Laurene Footman B, PA-C  escitalopram (LEXAPRO) 10 MG tablet Take 10 mg by mouth daily.    [provider]  ondansetron (ZOFRAN-ODT) 4 MG disintegrating tablet Take 1 tablet (4 mg total) by mouth every 8 (eight) hours as needed for nausea or vomiting. 08/05/19   Triplett, Cari B, FNP  pantoprazole (PROTONIX) 20 MG tablet Take 1 tablet (20 mg total) by mouth daily. 01/04/20 01/03/21  Merlyn Lot, MD  promethazine (PHENERGAN) 12.5 MG tablet Take 1 tablet (12.5 mg total) by  mouth every 6 (six) hours as needed. 01/04/20   Merlyn Lot, MD    Family History History reviewed. No pertinent family history.  Social History Social History   Tobacco Use   Smoking status: Every Day   Smokeless tobacco: Never  Substance Use Topics   Alcohol use: No   Drug use: Yes    Types: Marijuana     Allergies   Patient has no known allergies.   Review of Systems Review of Systems  Constitutional:  Positive for fatigue. Negative for chills, diaphoresis and fever.  HENT:  Positive for congestion, postnasal drip, rhinorrhea, sinus pressure and sinus pain. Negative for ear pain and sore throat.   Respiratory:  Negative for cough and shortness of breath.   Gastrointestinal:  Negative for abdominal pain, nausea and vomiting.  Musculoskeletal:  Negative for arthralgias and myalgias.  Skin:  Negative for rash.  Neurological:  Positive for headaches. Negative for weakness.  Hematological:  Negative for adenopathy.     Physical Exam Triage Vital Signs ED Triage Vitals  Enc Vitals Group     BP      Pulse      Resp      Temp      Temp src      SpO2      Weight      Height      Head Circumference      Peak Flow      Pain Score      Pain Loc      Pain  Edu?      Excl. in [place]?    No data found.  Updated Vital Signs BP 128/78 (BP Location: Left Arm)   Pulse 77   Temp 97.7 F (36.5 C) (Oral)   Ht 5' 5.5" (1.664 m)   Wt 201 lb (91.2 kg)   SpO2 97%   BMI 32.94 kg/m   Physical Exam Vitals and nursing note reviewed.  Constitutional:      General: She is not in acute distress.    Appearance: Normal appearance. She is well-developed. She is not ill-appearing or toxic-appearing.  HENT:     Head: Normocephalic and atraumatic.     Right Ear: Ear canal and external ear normal. A middle ear effusion is present.     Left Ear: Ear canal and external ear normal. A middle ear effusion is present.     Nose: Congestion present.     Mouth/Throat:     Mouth:  Mucous membranes are moist.     Pharynx: Oropharynx is clear.  Eyes:     General: No scleral icterus.       Right eye: No discharge.        Left eye: No discharge.     Conjunctiva/sclera: Conjunctivae normal.  Cardiovascular:     Rate and Rhythm: Normal rate and regular rhythm.     Heart sounds: Normal heart sounds.  Pulmonary:     Effort: Pulmonary effort is normal. No respiratory distress.     Breath sounds: Normal breath sounds.  Musculoskeletal:     Cervical back: Neck supple.  Skin:    General: Skin is dry.  Neurological:     General: No focal deficit present.     Mental Status: She is alert. Mental status is at baseline.     Motor: No weakness.     Gait: Gait normal.  Psychiatric:        Mood and Affect: Mood normal.        Behavior: Behavior normal.        Thought Content: Thought content normal.      UC Treatments / Results  Labs (all labs ordered are listed, but only abnormal results are displayed) Labs Reviewed - No data to display  EKG   Radiology No results found.  Procedures Procedures (including critical care time)  Medications Ordered in UC Medications - No data to display  Initial Impression / Assessment and Plan / UC Course  I have reviewed the triage vital signs and the nursing notes.  Pertinent labs & imaging results that were available during my care of the patient were reviewed by me and considered in my medical decision making (see chart for details).   37 year old female with history of allergies and sinus infections presents for 2-week history of sinus pain/pressure, nasal congestion with yellowish drainage, headaches and fatigue.  No fever.  Denies cough or breathing trouble.  Vitals normal stable and she is overall well-appearing.  On exam she has clear effusion of bilateral TMs, no significant nasal congestion and nasal mucosal edema, postnasal drainage.  Throat is clear.  Chest clear to auscultation.  Suspect acute sinusitis which  may be due to bacterial or allergic cause.  Will cover patient for the possibility of both.  Sent Atrovent nasal spray and advised behind the counter Allegra-D.  Also sent prednisone and Augmentin.  Encouraged rest and fluids.  Reviewed return precautions.   Final Clinical Impressions(s) / UC Diagnoses   Final diagnoses:  Acute sinusitis, recurrence not specified, unspecified  location  Acute nonintractable headache, unspecified headache type  Nasal congestion     Discharge Instructions      -Purchase behind the counter Claritin D or Allegra D -I have sent a nasal spray, antibiotic and corticosteroids -You can also use nasal saline. May use Afrin up to 3 days -Ibuprofen 800 mg 3x daily as needed for headache -Tylenol up to 1 gram 3 x daily for headache     ED Prescriptions     Medication Sig Dispense Auth. Provider   ipratropium (ATROVENT) 0.06 % nasal spray Place 2 sprays into both nostrils 4 (four) times daily. 15 mL Laurene Footman B, PA-C   predniSONE (DELTASONE) 20 MG tablet Take 2 tablets (40 mg total) by mouth daily for 5 days. 10 tablet Laurene Footman B, PA-C   amoxicillin-clavulanate (AUGMENTIN) 875-125 MG tablet Take 1 tablet by mouth every 12 (twelve) hours for 7 days. 14 tablet Gretta Cool      PDMP not reviewed this encounter.   Danton Clap, PA-C 08/23/22 1927

## 2022-08-23 NOTE — Discharge Instructions (Signed)
-  Purchase behind the counter Claritin D or Allegra D -I have sent a nasal spray, antibiotic and corticosteroids -You can also use nasal saline. May use Afrin up to 3 days -Ibuprofen 800 mg 3x daily as needed for headache -Tylenol up to 1 gram 3 x daily for headache

## 2022-12-02 ENCOUNTER — Ambulatory Visit
Admission: EM | Admit: 2022-12-02 | Discharge: 2022-12-02 | Disposition: A | Discharge status: Home / Self Care | Payer: Medicaid Other | Attending: Emergency Medicine | Admitting: Emergency Medicine

## 2022-12-02 DIAGNOSIS — J020 Streptococcal pharyngitis: Secondary | ICD-10-CM | POA: Diagnosis present

## 2022-12-02 LAB — GROUP A STREP BY PCR: Group A Strep by PCR: DETECTED — AB

## 2022-12-02 MED ORDER — AMOXICILLIN-POT CLAVULANATE 875-125 MG PO TABS: 1.0000 | ORAL_TABLET | Freq: Two times a day (BID) | ORAL | 0 refills | Status: AC

## 2022-12-02 NOTE — ED Provider Notes (Signed)
[facility]    CSN: [id number] Arrival date & time: 12/02/22  1139      History   Chief Complaint Chief Complaint  Patient presents with   Sore Throat   Fever   Generalized Body Aches   Chills    HPI April Copeland is a 37 y.o. female.   HPI  37 year old female presents for evaluation of 1 day worth of respiratory symptoms that include a fever with a Tmax of 101, sore throat, chills, and bodyaches.  She denies runny nose, nasal congestion, or cough.  She does have school-aged children in the house but no one else has symptoms similar to hers.  History reviewed. No pertinent past medical history.  Patient Active Problem List   Diagnosis Date Noted   Acute colitis 01/28/2018    History reviewed. No pertinent surgical history.  OB History   No obstetric history on file.      Home Medications    Prior to Admission medications   Medication Sig Start Date End Date Taking? Authorizing Provider  amoxicillin-clavulanate (AUGMENTIN) 875-125 MG tablet Take 1 tablet by mouth every 12 (twelve) hours for 10 days. 12/02/22 12/12/22 Yes Becky Augusta, NP  escitalopram (LEXAPRO) 10 MG tablet Take 10 mg by mouth daily.   Yes [provider]  ipratropium (ATROVENT) 0.06 % nasal spray Place 2 sprays into both nostrils 4 (four) times daily. 08/23/22  Yes Eusebio Friendly B, PA-C  ondansetron (ZOFRAN-ODT) 4 MG disintegrating tablet Take 1 tablet (4 mg total) by mouth every 8 (eight) hours as needed for nausea or vomiting. 08/05/19  Yes Triplett, Cari B, FNP  pantoprazole (PROTONIX) 20 MG tablet Take 1 tablet (20 mg total) by mouth daily. 01/04/20 12/02/22 Yes Willy Eddy, MD  promethazine (PHENERGAN) 12.5 MG tablet Take 1 tablet (12.5 mg total) by mouth every 6 (six) hours as needed. 01/04/20  Yes Willy Eddy, MD    Family History History reviewed. No pertinent family history.  Social History Social History   Tobacco Use   Smoking status: Every Day    Smokeless tobacco: Never  Substance Use Topics   Alcohol use: No   Drug use: Yes    Types: Marijuana     Allergies   Patient has no known allergies.   Review of Systems Review of Systems  Constitutional:  Positive for fever.  HENT:  Positive for sore throat. Negative for congestion, ear pain and rhinorrhea.   Respiratory:  Negative for cough.      Physical Exam Triage Vital Signs ED Triage Vitals  Enc Vitals Group     BP 12/02/22 1200 118/77     Pulse Rate 12/02/22 1200 73     Resp 12/02/22 1200 16     Temp 12/02/22 1200 97.8 F (36.6 C)     Temp Source 12/02/22 1200 Oral     SpO2 12/02/22 1200 96 %     Weight 12/02/22 1200 190 lb (86.2 kg)     Height 12/02/22 1200 5\' 5"  (1.651 m)     Head Circumference --      Peak Flow --      Pain Score 12/02/22 1205 8     Pain Loc --      Pain Edu? --      Excl. in [place]? --    No data found.  Updated Vital Signs BP 118/77 (BP Location: Right Arm)   Pulse 73   Temp 97.8 F (36.6 C) (Oral)   Resp 16  Ht 5\' 5"  (1.651 m)   Wt 190 lb (86.2 kg)   SpO2 96%   BMI 31.62 kg/m   Visual Acuity Right Eye Distance:   Left Eye Distance:   Bilateral Distance:    Right Eye Near:   Left Eye Near:    Bilateral Near:     Physical Exam Vitals and nursing note reviewed.  Constitutional:      Appearance: Normal appearance. She is ill-appearing.  HENT:     Head: Normocephalic and atraumatic.     Right Ear: Tympanic membrane, ear canal and external ear normal. There is no impacted cerumen.     Left Ear: Tympanic membrane, ear canal and external ear normal. There is no impacted cerumen.     Nose: Congestion and rhinorrhea present.     Comments: Nasal mucosa is erythematous edematous and there is thick yellow discharge in both nares.    Mouth/Throat:     Mouth: Mucous membranes are moist.     Pharynx: Oropharynx is clear. Posterior oropharyngeal erythema present. No oropharyngeal exudate.     Comments: Bilateral tonsillar pillars  and soft palate are erythematous.  Posterior pharynx is free of edema, erythema, or injection. Cardiovascular:     Rate and Rhythm: Normal rate and regular rhythm.     Pulses: Normal pulses.     Heart sounds: Normal heart sounds. No murmur heard.    No friction rub. No gallop.  Pulmonary:     Effort: Pulmonary effort is normal.     Breath sounds: Normal breath sounds. No wheezing, rhonchi or rales.  Musculoskeletal:     Cervical back: Normal range of motion and neck supple.  Lymphadenopathy:     Cervical: No cervical adenopathy.  Skin:    General: Skin is warm and dry.     Capillary Refill: Capillary refill takes less than 2 seconds.     Findings: No erythema or rash.  Neurological:     General: No focal deficit present.     Mental Status: She is alert and oriented to person, place, and time.      UC Treatments / Results  Labs (all labs ordered are listed, but only abnormal results are displayed) Labs Reviewed  GROUP A STREP BY PCR - Abnormal; Notable for the following components:      Result Value   Group A Strep by PCR DETECTED (*)    All other components within normal limits    EKG   Radiology No results found.  Procedures Procedures (including critical care time)  Medications Ordered in UC Medications - No data to display  Initial Impression / Assessment and Plan / UC Course  I have reviewed the triage vital signs and the nursing notes.  Pertinent labs & imaging results that were available during my care of the patient were reviewed by me and considered in my medical decision making (see chart for details).   Patient is a pleasant, though mildly ill-appearing 37 year old female presenting for evaluation of 1 day worth of upper respiratory symptoms as outlined in HPI above.  She does have edematous and erythematous tonsillar pillars but they are free of exudate.  She also has inflamed nasal mucosa with thick yellow discharge in both nares.  I suspect that the  patient has an upper respiratory infection.  Given her sore throat and erythema I will also order a strep PCR to rule out the presence of strep.  Strep PCR is positive.  I will discharge patient on the diagnosis strep  pharyngitis on Augmentin 875 mg twice daily for 10 days.  Tylenol and or ibuprofen as needed for pain.  Salt water gargles and over-the-counter Chloraseptic or Sucrets lozenges.  Return precautions reviewed.  Work note provided.   Final Clinical Impressions(s) / UC Diagnoses   Final diagnoses:  Strep pharyngitis     Discharge Instructions      Take the Augmentin twice daily for 10 days for treatment of your strep throat.  Gargle with warm salt water 2-3 times a day to soothe your throat, aid in pain relief, and aid in healing.  Take over-the-counter ibuprofen according to the package instructions as needed for pain.  You can also use Chloraseptic or Sucrets lozenges, 1 lozenge every 2 hours as needed for throat pain.  If you develop any new or worsening symptoms return for reevaluation.      ED Prescriptions     Medication Sig Dispense Auth. Provider   amoxicillin-clavulanate (AUGMENTIN) 875-125 MG tablet Take 1 tablet by mouth every 12 (twelve) hours for 10 days. 20 tablet Becky Augusta, NP      PDMP not reviewed this encounter.   Becky Augusta, NP 12/02/22 1243

## 2022-12-02 NOTE — Discharge Instructions (Addendum)
Take the Augmentin twice daily for 10 days for treatment of your strep throat.  Gargle with warm salt water 2-3 times a day to soothe your throat, aid in pain relief, and aid in healing.  Take over-the-counter ibuprofen according to the package instructions as needed for pain.  You can also use Chloraseptic or Sucrets lozenges, 1 lozenge every 2 hours as needed for throat pain.  If you develop any new or worsening symptoms return for reevaluation.  

## 2022-12-02 NOTE — ED Triage Notes (Signed)
Pt c/o sore throat,chills,bodyaches & fever. Tmax 101 around 0300 this AM. Has tried theraflu w/o relief.

## 2022-12-03 ENCOUNTER — Telehealth: Payer: Self-pay | Admitting: Emergency Medicine

## 2022-12-03 MED ORDER — CEFDINIR 300 MG PO CAPS: 300.0000 mg | ORAL_CAPSULE | Freq: Two times a day (BID) | ORAL | 0 refills | Status: DC

## 2022-12-03 NOTE — Telephone Encounter (Signed)
Started amoxicillin COVID-19 1 day ago for treatment of strep pharyngitis, notify urgent care today of rash, advised to discontinue use of medication, cefdinir sent to pharmacy alternative, Augmentin listed as allergy

## 2023-06-03 ENCOUNTER — Encounter: Payer: Self-pay | Admitting: Emergency Medicine

## 2023-06-03 ENCOUNTER — Ambulatory Visit
Admission: EM | Admit: 2023-06-03 | Discharge: 2023-06-03 | Disposition: A | Discharge status: Home / Self Care | Payer: MEDICAID

## 2023-06-03 DIAGNOSIS — H9201 Otalgia, right ear: Secondary | ICD-10-CM | POA: Diagnosis not present

## 2023-06-03 DIAGNOSIS — J019 Acute sinusitis, unspecified: Secondary | ICD-10-CM

## 2023-06-03 DIAGNOSIS — R519 Headache, unspecified: Secondary | ICD-10-CM

## 2023-06-03 MED ORDER — CEFDINIR 300 MG PO CAPS: 300.0000 mg | ORAL_CAPSULE | Freq: Two times a day (BID) | ORAL | 0 refills | Status: AC

## 2023-06-03 MED ORDER — IPRATROPIUM BROMIDE 0.06 % NA SOLN: 2.0000 | Freq: Four times a day (QID) | NASAL | 0 refills | Status: AC

## 2023-06-03 MED ORDER — PREDNISONE 20 MG PO TABS: 40.0000 mg | ORAL_TABLET | Freq: Every day | ORAL | 0 refills | Status: AC

## 2023-06-03 NOTE — ED Provider Notes (Signed)
[facility]    CSN: [id number] Arrival date & time: 06/03/23  1232      History   Chief Complaint Chief Complaint  Patient presents with   Headache    HPI April Copeland is a 37 y.o. female presenting for 2-week history of right sided headache and ear pain, nasal congestion, mostly clear to light yellow nasal drainage, maxillary sinus pain with pressure behind the eyes, postnasal drainage, and fatigue.  Patient denies fever, sore throat, cough, breathing difficulty, vomiting or diarrhea.  Reports trying multiple over-the-counter decongestants, antihistamines and nasal sprays all without relief.  Symptoms have not worsened or improved from onset.  History of sinus problems and allergies.  HPI  History reviewed. No pertinent past medical history.  Patient Active Problem List   Diagnosis Date Noted   Acute colitis 01/28/2018    History reviewed. No pertinent surgical history.  OB History   No obstetric history on file.      Home Medications    Prior to Admission medications   Medication Sig Start Date End Date Taking? Authorizing Provider  cefdinir (OMNICEF) 300 MG capsule Take 1 capsule (300 mg total) by mouth 2 (two) times daily for 7 days. 06/03/23 06/10/23 Yes Eusebio Friendly B, PA-C  gabapentin (NEURONTIN) 300 MG capsule Take 300 mg by mouth 3 (three) times daily. 03/29/23  Yes [provider]  ipratropium (ATROVENT) 0.06 % nasal spray Place 2 sprays into both nostrils 4 (four) times daily. 06/03/23  Yes Shirlee Latch, PA-C  predniSONE (DELTASONE) 20 MG tablet Take 2 tablets (40 mg total) by mouth daily for 5 days. 06/03/23 06/08/23 Yes Eusebio Friendly B, PA-C  escitalopram (LEXAPRO) 10 MG tablet Take 10 mg by mouth daily.    [provider]    Family History History reviewed. No pertinent family history.  Social History Social History   Tobacco Use   Smoking status: Every Day    Types: Cigarettes   Smokeless tobacco: Never  Vaping  Use   Vaping status: Never Used  Substance Use Topics   Alcohol use: No   Drug use: Yes    Types: Marijuana     Allergies   Augmentin [amoxicillin-pot clavulanate]   Review of Systems Review of Systems  Constitutional:  Positive for fatigue. Negative for chills, diaphoresis and fever.  HENT:  Positive for congestion, ear pain, postnasal drip, rhinorrhea, sinus pressure and sinus pain. Negative for sore throat.   Respiratory:  Negative for cough and shortness of breath.   Cardiovascular:  Negative for chest pain.  Gastrointestinal:  Negative for abdominal pain, nausea and vomiting.  Musculoskeletal:  Negative for arthralgias and myalgias.  Skin:  Negative for rash.  Neurological:  Positive for headaches. Negative for weakness.  Hematological:  Negative for adenopathy.     Physical Exam Triage Vital Signs ED Triage Vitals  Enc Vitals Group     BP      Pulse      Resp      Temp      Temp src      SpO2      Weight      Height      Head Circumference      Peak Flow      Pain Score      Pain Loc      Pain Edu?      Excl. in [place]?    No data found.  Updated Vital Signs BP 111/66 (BP Location: Left Arm)  Pulse 70   Temp 97.9 F (36.6 C) (Oral)   Resp 14   Ht 5\' 5"  (1.651 m)   Wt 190 lb 0.6 oz (86.2 kg)   LMP 05/13/2023 (Approximate)   SpO2 98%   BMI 31.62 kg/m   Physical Exam Vitals and nursing note reviewed.  Constitutional:      General: She is not in acute distress.    Appearance: Normal appearance. She is well-developed. She is not ill-appearing or toxic-appearing.  HENT:     Head: Normocephalic and atraumatic.     Right Ear: Ear canal and external ear normal. A middle ear effusion is present.     Left Ear: Ear canal and external ear normal. A middle ear effusion is present.     Nose: Congestion present.     Mouth/Throat:     Mouth: Mucous membranes are moist.     Pharynx: Oropharynx is clear.  Eyes:     General: No scleral icterus.       Right  eye: No discharge.        Left eye: No discharge.     Conjunctiva/sclera: Conjunctivae normal.  Cardiovascular:     Rate and Rhythm: Normal rate and regular rhythm.     Heart sounds: Normal heart sounds.  Pulmonary:     Effort: Pulmonary effort is normal. No respiratory distress.     Breath sounds: Normal breath sounds.  Musculoskeletal:     Cervical back: Neck supple.  Skin:    General: Skin is dry.  Neurological:     General: No focal deficit present.     Mental Status: She is alert. Mental status is at baseline.     Motor: No weakness.     Gait: Gait normal.  Psychiatric:        Mood and Affect: Mood normal.        Behavior: Behavior normal.        Thought Content: Thought content normal.      UC Treatments / Results  Labs (all labs ordered are listed, but only abnormal results are displayed) Labs Reviewed - No data to display  EKG   Radiology No results found.  Procedures Procedures (including critical care time)  Medications Ordered in UC Medications - No data to display  Initial Impression / Assessment and Plan / UC Course  I have reviewed the triage vital signs and the nursing notes.  Pertinent labs & imaging results that were available during my care of the patient were reviewed by me and considered in my medical decision making (see chart for details).   37 year old female with history of allergies and sinus infections presents for 2-week history of sinus pain/pressure, nasal congestion with mostly clear drainage, headaches and fatigue.  No fever.  Denies cough or breathing trouble.  Vitals normal stable and she is overall well-appearing.  On exam she has clear effusion of bilateral TMs, no significant nasal congestion and nasal mucosal edema, postnasal drainage.  Throat is clear.  Chest clear to auscultation.  Suspect acute sinusitis which may be due to bacterial or allergic cause.  Will cover patient for the possibility of both.  Sent Atrovent nasal  spray and advised behind the counter Allegra-D.  Also sent prednisone and Cefdinir.  Encouraged rest and fluids.  Reviewed return precautions.   Final Clinical Impressions(s) / UC Diagnoses   Final diagnoses:  Acute sinusitis, recurrence not specified, unspecified location  Otalgia of right ear  Acute nonintractable headache, unspecified headache type  Discharge Instructions      -Purchase behind the counter Claritin D or Allegra D -I have sent a nasal spray, antibiotic and corticosteroids -You can also use nasal saline. May use Afrin up to 3 days -Ibuprofen 800 mg 3x daily as needed for headache -Tylenol up to 1 gram 3 x daily for headache        ED Prescriptions     Medication Sig Dispense Auth. Provider   predniSONE (DELTASONE) 20 MG tablet Take 2 tablets (40 mg total) by mouth daily for 5 days. 10 tablet Eusebio Friendly B, PA-C   ipratropium (ATROVENT) 0.06 % nasal spray Place 2 sprays into both nostrils 4 (four) times daily. 15 mL Eusebio Friendly B, PA-C   cefdinir (OMNICEF) 300 MG capsule Take 1 capsule (300 mg total) by mouth 2 (two) times daily for 7 days. 14 capsule Shirlee Latch, PA-C      PDMP not reviewed this encounter.      Shirlee Latch, PA-C 06/03/23 1316

## 2023-06-03 NOTE — Discharge Instructions (Signed)
-  Purchase behind the counter Claritin D or Allegra D -I have sent a nasal spray, antibiotic and corticosteroids -You can also use nasal saline. May use Afrin up to 3 days -Ibuprofen 800 mg 3x daily as needed for headache -Tylenol up to 1 gram 3 x daily for headache

## 2023-06-03 NOTE — ED Triage Notes (Signed)
Patient reports history of migraine headaches.  Patient c/o right sided headache and right ear pain that started 2 weeks ago.  Patient denies fevers.
